# Patient Record
Sex: Female | Born: 1960
Health system: Southern US, Community
[De-identification: ages and names within clinical notes are randomized; demographics above are authoritative.]

## PROBLEM LIST (undated history)

## (undated) DIAGNOSIS — K649 Unspecified hemorrhoids: Secondary | ICD-10-CM

## (undated) DIAGNOSIS — K5792 Diverticulitis of intestine, part unspecified, without perforation or abscess without bleeding: Secondary | ICD-10-CM

## (undated) DIAGNOSIS — L988 Other specified disorders of the skin and subcutaneous tissue: Secondary | ICD-10-CM

## (undated) DIAGNOSIS — G4730 Sleep apnea, unspecified: Secondary | ICD-10-CM

## (undated) DIAGNOSIS — I10 Essential (primary) hypertension: Secondary | ICD-10-CM

## (undated) DIAGNOSIS — E7800 Pure hypercholesterolemia, unspecified: Secondary | ICD-10-CM

## (undated) DIAGNOSIS — E669 Obesity, unspecified: Secondary | ICD-10-CM

## (undated) DIAGNOSIS — Z9889 Other specified postprocedural states: Secondary | ICD-10-CM

## (undated) DIAGNOSIS — R112 Nausea with vomiting, unspecified: Secondary | ICD-10-CM

## (undated) DIAGNOSIS — L280 Lichen simplex chronicus: Secondary | ICD-10-CM

## (undated) DIAGNOSIS — J302 Other seasonal allergic rhinitis: Secondary | ICD-10-CM

## (undated) HISTORY — DX: Obesity, unspecified: E66.9

## (undated) HISTORY — DX: Pure hypercholesterolemia, unspecified: E78.00

## (undated) HISTORY — DX: Diverticulitis of intestine, part unspecified, without perforation or abscess without bleeding: K57.92

## (undated) HISTORY — DX: Lichen simplex chronicus: L28.0

## (undated) HISTORY — DX: Other seasonal allergic rhinitis: J30.2

## (undated) HISTORY — DX: Unspecified hemorrhoids: K64.9

---

## 1989-08-02 HISTORY — PX: NASAL SINUS SURGERY: SHX719

## 1999-06-17 ENCOUNTER — Other Ambulatory Visit: Admission: RE | Admit: 1999-06-17 | Discharge: 1999-06-17 | Payer: Self-pay | Admitting: Obstetrics and Gynecology

## 2000-06-29 ENCOUNTER — Other Ambulatory Visit: Admission: RE | Admit: 2000-06-29 | Discharge: 2000-06-29 | Payer: Self-pay | Admitting: Obstetrics and Gynecology

## 2001-07-10 ENCOUNTER — Other Ambulatory Visit: Admission: RE | Admit: 2001-07-10 | Discharge: 2001-07-10 | Payer: Self-pay | Admitting: Obstetrics and Gynecology

## 2002-10-08 ENCOUNTER — Other Ambulatory Visit: Admission: RE | Admit: 2002-10-08 | Discharge: 2002-10-08 | Payer: Self-pay | Admitting: Obstetrics and Gynecology

## 2003-08-03 HISTORY — PX: CHOLECYSTECTOMY: SHX55

## 2003-11-05 ENCOUNTER — Other Ambulatory Visit: Admission: RE | Admit: 2003-11-05 | Discharge: 2003-11-05 | Payer: Self-pay | Admitting: Obstetrics and Gynecology

## 2005-06-03 ENCOUNTER — Other Ambulatory Visit: Admission: RE | Admit: 2005-06-03 | Discharge: 2005-06-03 | Payer: Self-pay | Admitting: Obstetrics and Gynecology

## 2009-11-18 ENCOUNTER — Ambulatory Visit (HOSPITAL_COMMUNITY): Admission: RE | Admit: 2009-11-18 | Discharge: 2009-11-18 | Payer: Self-pay | Admitting: Obstetrics and Gynecology

## 2010-10-20 LAB — COMPREHENSIVE METABOLIC PANEL
ALT: 16 U/L (ref 0–35)
AST: 16 U/L (ref 0–37)
Albumin: 4.1 g/dL (ref 3.5–5.2)
Alkaline Phosphatase: 77 U/L (ref 39–117)
BUN: 12 mg/dL (ref 6–23)
CO2: 26 mEq/L (ref 19–32)
Calcium: 9.2 mg/dL (ref 8.4–10.5)
Chloride: 103 mEq/L (ref 96–112)
Creatinine, Ser: 0.57 mg/dL (ref 0.4–1.2)
GFR calc Af Amer: >60 mL/min (ref >60)
GFR calc non Af Amer: >60 mL/min (ref >60)
Glucose, Bld: 90 mg/dL (ref 70–99)
Potassium: 4.4 mEq/L (ref 3.5–5.1)
Sodium: 137 mEq/L (ref 135–145)
Total Bilirubin: 0.9 mg/dL (ref 0.3–1.2)
Total Protein: 7.7 g/dL (ref 6.0–8.3)

## 2010-10-20 LAB — CBC
HCT: 34.2 % — ABNORMAL LOW (ref 36.0–46.0)
Hemoglobin: 11.9 g/dL — ABNORMAL LOW (ref 12.0–15.0)
MCHC: 34.7 g/dL (ref 30.0–36.0)
MCV: 86.6 fL (ref 78.0–100.0)
Platelets: 339 10*3/uL (ref 150–400)
RBC: 3.95 MIL/uL (ref 3.87–5.11)
RDW: 13.9 % (ref 11.5–15.5)
WBC: 6.3 10*3/uL (ref 4.0–10.5)

## 2010-10-20 LAB — HCG, SERUM, QUALITATIVE: Preg, Serum: NEGATIVE

## 2013-08-02 HISTORY — PX: ABDOMINAL HYSTERECTOMY: SHX81

## 2014-05-30 ENCOUNTER — Other Ambulatory Visit: Payer: Self-pay | Admitting: Family Medicine

## 2014-05-30 DIAGNOSIS — R1030 Lower abdominal pain, unspecified: Secondary | ICD-10-CM

## 2014-05-30 DIAGNOSIS — Z90710 Acquired absence of both cervix and uterus: Secondary | ICD-10-CM

## 2014-06-07 ENCOUNTER — Other Ambulatory Visit: Payer: Self-pay

## 2014-08-02 HISTORY — PX: COLONOSCOPY: SHX174

## 2015-03-09 ENCOUNTER — Encounter (HOSPITAL_COMMUNITY): Payer: Self-pay

## 2015-03-09 ENCOUNTER — Emergency Department (HOSPITAL_COMMUNITY)
Admission: EM | Admit: 2015-03-09 | Discharge: 2015-03-09 | Disposition: A | Discharge status: Home / Self Care | Payer: 59 | Attending: Emergency Medicine | Admitting: Emergency Medicine

## 2015-03-09 ENCOUNTER — Emergency Department (HOSPITAL_COMMUNITY): Payer: 59

## 2015-03-09 DIAGNOSIS — R911 Solitary pulmonary nodule: Secondary | ICD-10-CM | POA: Diagnosis not present

## 2015-03-09 DIAGNOSIS — Z79899 Other long term (current) drug therapy: Secondary | ICD-10-CM | POA: Insufficient documentation

## 2015-03-09 DIAGNOSIS — Z9049 Acquired absence of other specified parts of digestive tract: Secondary | ICD-10-CM | POA: Insufficient documentation

## 2015-03-09 DIAGNOSIS — Z90710 Acquired absence of both cervix and uterus: Secondary | ICD-10-CM | POA: Diagnosis not present

## 2015-03-09 DIAGNOSIS — R1032 Left lower quadrant pain: Secondary | ICD-10-CM | POA: Diagnosis present

## 2015-03-09 DIAGNOSIS — K5732 Diverticulitis of large intestine without perforation or abscess without bleeding: Secondary | ICD-10-CM

## 2015-03-09 DIAGNOSIS — R109 Unspecified abdominal pain: Secondary | ICD-10-CM

## 2015-03-09 DIAGNOSIS — I10 Essential (primary) hypertension: Secondary | ICD-10-CM | POA: Diagnosis not present

## 2015-03-09 HISTORY — DX: Essential (primary) hypertension: I10

## 2015-03-09 LAB — CBC
HCT: 38.1 % (ref 36.0–46.0)
Hemoglobin: 12.6 g/dL (ref 12.0–15.0)
MCH: 27.9 pg (ref 26.0–34.0)
MCHC: 33.1 g/dL (ref 30.0–36.0)
MCV: 84.5 fL (ref 78.0–100.0)
Platelets: 137 10*3/uL — ABNORMAL LOW (ref 150–400)
RBC: 4.51 MIL/uL (ref 3.87–5.11)
RDW: 14.8 % (ref 11.5–15.5)
WBC: 7.2 10*3/uL (ref 4.0–10.5)

## 2015-03-09 LAB — URINALYSIS, ROUTINE W REFLEX MICROSCOPIC
Bilirubin Urine: NEGATIVE
Glucose, UA: NEGATIVE mg/dL
Hgb urine dipstick: NEGATIVE
Ketones, ur: NEGATIVE mg/dL
Leukocytes, UA: NEGATIVE
Nitrite: NEGATIVE
Protein, ur: NEGATIVE mg/dL
Specific Gravity, Urine: 1.005 (ref 1.005–1.030)
Urobilinogen, UA: 0.2 mg/dL (ref 0.0–1.0)
pH: 6 (ref 5.0–8.0)

## 2015-03-09 LAB — COMPREHENSIVE METABOLIC PANEL
ALT: 38 U/L (ref 14–54)
AST: 35 U/L (ref 15–41)
Albumin: 4.4 g/dL (ref 3.5–5.0)
Alkaline Phosphatase: 105 U/L (ref 38–126)
Anion gap: 6 (ref 5–15)
BUN: 14 mg/dL (ref 6–20)
CO2: 23 mmol/L (ref 22–32)
Calcium: 8.9 mg/dL (ref 8.9–10.3)
Chloride: 107 mmol/L (ref 101–111)
Creatinine, Ser: 0.62 mg/dL (ref 0.44–1.00)
GFR calc Af Amer: >60 mL/min (ref >60)
GFR calc non Af Amer: >60 mL/min (ref >60)
Glucose, Bld: 105 mg/dL — ABNORMAL HIGH (ref 65–99)
Potassium: 4.2 mmol/L (ref 3.5–5.1)
Sodium: 136 mmol/L (ref 135–145)
Total Bilirubin: 1.5 mg/dL — ABNORMAL HIGH (ref 0.3–1.2)
Total Protein: 8 g/dL (ref 6.5–8.1)

## 2015-03-09 LAB — LIPASE, BLOOD: Lipase: 16 U/L — ABNORMAL LOW (ref 22–51)

## 2015-03-09 MED ORDER — METRONIDAZOLE 500 MG PO TABS: 500.0000 mg | ORAL_TABLET | Freq: Three times a day (TID) | ORAL | Status: DC

## 2015-03-09 MED ORDER — HYDROCODONE-ACETAMINOPHEN 5-325 MG PO TABS: 1.0000 | ORAL_TABLET | ORAL | Status: DC | PRN

## 2015-03-09 MED ORDER — CIPROFLOXACIN HCL 500 MG PO TABS: 500.0000 mg | ORAL_TABLET | Freq: Two times a day (BID) | ORAL | Status: DC

## 2015-03-09 MED ORDER — ONDANSETRON 4 MG PO TBDP: 4.0000 mg | ORAL_TABLET | Freq: Three times a day (TID) | ORAL | Status: DC | PRN

## 2015-03-09 MED ORDER — ONDANSETRON HCL 4 MG/2ML IJ SOLN
4.0000 mg | Freq: Once | INTRAMUSCULAR | Status: AC
  Administered 2015-03-09: 4 mg via INTRAVENOUS
  Filled 2015-03-09: qty 2

## 2015-03-09 MED ORDER — METRONIDAZOLE 500 MG PO TABS
500.0000 mg | ORAL_TABLET | Freq: Once | ORAL | Status: AC
  Administered 2015-03-09: 500 mg via ORAL
  Filled 2015-03-09: qty 1

## 2015-03-09 MED ORDER — CIPROFLOXACIN HCL 500 MG PO TABS
500.0000 mg | ORAL_TABLET | Freq: Once | ORAL | Status: AC
  Administered 2015-03-09: 500 mg via ORAL
  Filled 2015-03-09: qty 1

## 2015-03-09 MED ORDER — MORPHINE SULFATE 4 MG/ML IJ SOLN
4.0000 mg | INTRAMUSCULAR | Status: DC | PRN
Start: 2015-03-09 — End: 2015-03-10
  Administered 2015-03-09: 4 mg via INTRAVENOUS
  Filled 2015-03-09: qty 1

## 2015-03-09 MED ORDER — IOHEXOL 300 MG/ML  SOLN
100.0000 mL | Freq: Once | INTRAMUSCULAR | Status: AC | PRN
  Administered 2015-03-09: 100 mL via INTRAVENOUS

## 2015-03-09 MED ORDER — IOHEXOL 300 MG/ML  SOLN
25.0000 mL | Freq: Once | INTRAMUSCULAR | Status: AC | PRN
  Administered 2015-03-09: 25 mL via ORAL

## 2015-03-09 NOTE — ED Provider Notes (Signed)
CSN: [id number]     Arrival date & time 03/09/15  1602 History   First MD Initiated Contact with Patient 03/09/15 1719     Chief Complaint  Patient presents with  . Abdominal Pain  . Fever      HPI  Vision presents for evaluation of left lower quadrant pain. Symptoms for 3 days. Nausea today but no vomiting. Fever to 102 at home today. Seen in urgent care and referred here. Tomato a few years ago. Had some intermittent crampy abdominal pain for the last year. At one point was tried on a medication "for irritable bowel". None recently.  Denies bloody stools. Has had prior colonoscopy which demonstrated diverticuli. No episodes of diverticulitis in the past.   Past Medical History  Diagnosis Date  . Hypertension    Past Surgical History  Procedure Laterality Date  . Abdominal hysterectomy    . Cholecystectomy     History reviewed. No pertinent family history. History  Substance Use Topics  . Smoking status: Never Smoker   . Smokeless tobacco: Not on file  . Alcohol Use: No   OB History    No data available     Review of Systems  Constitutional: Positive for fever. Negative for chills, diaphoresis, appetite change and fatigue.  HENT: Negative for mouth sores, sore throat and trouble swallowing.   Eyes: Negative for visual disturbance.  Respiratory: Negative for cough, chest tightness, shortness of breath and wheezing.   Cardiovascular: Negative for chest pain.  Gastrointestinal: Positive for nausea and abdominal pain. Negative for vomiting, diarrhea and abdominal distention.  Endocrine: Negative for polydipsia, polyphagia and polyuria.  Genitourinary: Negative for dysuria, frequency and hematuria.  Musculoskeletal: Negative for gait problem.  Skin: Negative for color change, pallor and rash.  Neurological: Negative for dizziness, syncope, light-headedness and headaches.  Hematological: Does not bruise/bleed easily.  Psychiatric/Behavioral: Negative for behavioral  problems and confusion.      Allergies  Peanut-containing drug products and Sulfa antibiotics  Home Medications   Prior to Admission medications   Medication Sig Start Date End Date Taking? Authorizing Provider  acetaminophen (TYLENOL) 500 MG tablet Take 1,000 mg by mouth every 6 (six) hours as needed for mild pain or headache.   Yes Historical Provider, MD  Alum & Mag Hydroxide-Simeth (ANTACID ADVANCED PO) Take 1 tablet by mouth daily.   Yes Historical Provider, MD  Docusate Calcium (STOOL SOFTENER PO) Take 1 capsule by mouth daily.   Yes Historical Provider, MD  fexofenadine (ALLEGRA) 180 MG tablet Take 180 mg by mouth daily.   Yes Historical Provider, MD  metoprolol succinate (TOPROL-XL) 50 MG 24 hr tablet Take 50 mg by mouth daily with breakfast. 02/14/15  Yes Historical Provider, MD  Probiotic Product (PROBIOTIC DAILY) CAPS Take 1 capsule by mouth daily.   Yes Historical Provider, MD  ciprofloxacin (CIPRO) 500 MG tablet Take 1 tablet (500 mg total) by mouth every 12 (twelve) hours. 03/09/15   Rolland Porter, MD  HYDROcodone-acetaminophen (NORCO/VICODIN) 5-325 MG per tablet Take 1 tablet by mouth every 4 (four) hours as needed. 03/09/15   Rolland Porter, MD  metroNIDAZOLE (FLAGYL) 500 MG tablet Take 1 tablet (500 mg total) by mouth 3 (three) times daily. 03/09/15   Rolland Porter, MD  ondansetron (ZOFRAN ODT) 4 MG disintegrating tablet Take 1 tablet (4 mg total) by mouth every 8 (eight) hours as needed for nausea. 03/09/15   Rolland Porter, MD   BP 132/72 mmHg  Pulse 93  Temp(Src) 99.7 F (37.6 C) (  Oral)  Resp 16  SpO2 96% Physical Exam  Constitutional: She is oriented to person, place, and time. She appears well-developed and well-nourished. No distress.  HENT:  Head: Normocephalic.  Eyes: Conjunctivae are normal. Pupils are equal, round, and reactive to light. No scleral icterus.  Neck: Normal range of motion. Neck supple. No thyromegaly present.  Cardiovascular: Normal rate and regular rhythm.  Exam  reveals no gallop and no friction rub.   No murmur heard. Pulmonary/Chest: Effort normal and breath sounds normal. No respiratory distress. She has no wheezes. She has no rales.  Abdominal: Soft. Bowel sounds are normal. She exhibits no distension. There is tenderness. There is no rebound.    Tenderness without rebound.  Musculoskeletal: Normal range of motion.  Neurological: She is alert and oriented to person, place, and time.  Skin: Skin is warm and dry. No rash noted.  Psychiatric: She has a normal mood and affect. Her behavior is normal.    ED Course  Procedures (including critical care time) Labs Review Labs Reviewed  LIPASE, BLOOD - Abnormal; Notable for the following:    Lipase 16 (*)    All other components within normal limits  COMPREHENSIVE METABOLIC PANEL - Abnormal; Notable for the following:    Glucose, Bld 105 (*)    Total Bilirubin 1.5 (*)    All other components within normal limits  CBC - Abnormal; Notable for the following:    Platelets 137 (*)    All other components within normal limits  URINALYSIS, ROUTINE W REFLEX MICROSCOPIC (NOT AT [facility])    Imaging Review Ct Abdomen Pelvis W Contrast  03/09/2015   CLINICAL DATA:  Left lower quadrant abdominal pain and fever for 1 day.  EXAM: CT ABDOMEN AND PELVIS WITH CONTRAST  TECHNIQUE: Multidetector CT imaging of the abdomen and pelvis was performed using the standard protocol following bolus administration of intravenous contrast.  CONTRAST:  OMNIPAQUE IOHEXOL 300 MG/ML SOLN, 59mL OMNIPAQUE IOHEXOL 300 MG/ML SOLN  COMPARISON:  None.  FINDINGS: Lower chest:  Small pulmonary nodules are noted at the lung bases.  3.5 mm nodule in the right middle lobe on image 1.  5.5 mm subpleural pulmonary nodule in the right lower lobe on image 6.  13 mm right lower lobe pulmonary nodule on image number 8.  3 mm subpleural nodule in the left lower lobe on image number 5 appear  4.5 mm nodule in the left lower lobe on image number 11.   These are in indeterminate finding but I would recommend a full dedicated chest CT with contrast for further evaluation. No pleural effusion. The heart is normal in size. No pericardial effusion. There is a small hiatal hernia.  Hepatobiliary: Mild diffuse fatty infiltration of the liver but no focal hepatic lesions or intrahepatic biliary dilatation. The gallbladder surgically absent. No common bile duct dilatation.  Pancreas: No mass, inflammation or ductal dilatation.  Spleen: Normal size.  No focal lesions.  Adrenals/Urinary Tract: The adrenal glands are not normal. There is an upper pole right renal calculus but no obstructing ureteral calculi or bladder calculi. No renal mass lesions. No bladder lesions.  Stomach/Bowel: The stomach, duodenum and small bowel are unremarkable. No inflammatory changes, mass lesions or obstructive findings.  There is acute uncomplicated diverticulitis involving the upper sigmoid colon with marked wall thickening, submucosal edema and surrounding inflammation in the sigmoid mesocolon. No abscess or free air.  Vascular/Lymphatic: No mesenteric or retroperitoneal mass or adenopathy. Small scattered lymph nodes are noted. The  aorta and branch vessels are normal. The major venous structures are patent.  Other: There are mildly enlarged/inflamed pelvic lymph nodes. No free pelvic fluid collections. The bladder is normal. The uterus is surgically absent. No inguinal mass or adenopathy. There is a periumbilical abdominal wall hernia containing fat.  Musculoskeletal: No significant bony findings.  IMPRESSION: 1. Uncomplicated acute diverticulitis involving the upper sigmoid colon. 2. Small hiatal hernia. 3. Right renal calculus. 4. Multiple pulmonary nodules are noted at the lung bases. Recommend dedicated full chest CT for further evaluation (non urgent).   Electronically Signed   By: Rudie Meyer M.D.   On: 03/09/2015 20:25     EKG Interpretation None      MDM   Final  diagnoses:  AP (abdominal pain)  Diverticulitis of large intestine without perforation or abscess without bleeding  Pulmonary nodule    I discussed the brain nodules with her. Recommended she undergo routine follow-up with her primary care physician for outpatient CT of the chest.  He is tolerating by mouth. Her pain and nausea are under control. Given first dose Cipro and Flagyl by mouth. She has a strong desire to be treated at home. I think this is appropriate. Plan is home, liquid diet. Low residue diet until improved or resolved. Cipro, Flagyl, Vicodin, Zofran. Primary care follow-up as above.    Rolland Porter, MD 03/09/15 2109

## 2015-03-09 NOTE — ED Notes (Signed)
Pt c/o LLQ pain and fever x 1 day.  Pain score 10/10 w/ palpation.  Denies n/v/d.  Pt reports she was sent by an Urgent Care.

## 2015-03-09 NOTE — Discharge Instructions (Signed)
Clear liquid diet. Slowly advance to a low residue diet as tolerated. Return to emergency room with worsening pain and vomiting or inability to keep down fluids or medications. You have benign appearing pulmonary nodules on your CT scan. It is recommended that you get a follow-up CT scan of your chest. This can be done routinely through your primary care physician.  Abdominal Pain Many things can cause abdominal pain. Usually, abdominal pain is not caused by a disease and will improve without treatment. It can often be observed and treated at home. Your health care provider will do a physical exam and possibly order blood tests and X-rays to help determine the seriousness of your pain. However, in many cases, more time must pass before a clear cause of the pain can be found. Before that point, your health care provider may not know if you need more testing or further treatment. HOME CARE INSTRUCTIONS  Monitor your abdominal pain for any changes. The following actions may help to alleviate any discomfort you are experiencing:  Only take over-the-counter or prescription medicines as directed by your health care provider.  Do not take laxatives unless directed to do so by your health care provider.  Try a clear liquid diet (broth, tea, or water) as directed by your health care provider. Slowly move to a bland diet as tolerated. SEEK MEDICAL CARE IF:  You have unexplained abdominal pain.  You have abdominal pain associated with nausea or diarrhea.  You have pain when you urinate or have a bowel movement.  You experience abdominal pain that wakes you in the night.  You have abdominal pain that is worsened or improved by eating food.  You have abdominal pain that is worsened with eating fatty foods.  You have a fever. SEEK IMMEDIATE MEDICAL CARE IF:   Your pain does not go away within 2 hours.  You keep throwing up (vomiting).  Your pain is felt only in portions of the abdomen, such as  the right side or the left lower portion of the abdomen.  You pass bloody or black tarry stools. MAKE SURE YOU:  Understand these instructions.   Will watch your condition.   Will get help right away if you are not doing well or get worse.  Document Released: 04/28/2005 Document Revised: 07/24/2013 Document Reviewed: 03/28/2013 [facility] Patient Information 2015 [place], [place]. This information is not intended to replace advice given to you by your health care provider. Make sure you discuss any questions you have with your health care provider.  Diverticulitis Diverticulitis is inflammation or infection of small pouches in your colon that form when you have a condition called diverticulosis. The pouches in your colon are called diverticula. Your colon, or large intestine, is where water is absorbed and stool is formed. Complications of diverticulitis can include:  Bleeding.  Severe infection.  Severe pain.  Perforation of your colon.  Obstruction of your colon. CAUSES  Diverticulitis is caused by bacteria. Diverticulitis happens when stool becomes trapped in diverticula. This allows bacteria to grow in the diverticula, which can lead to inflammation and infection. RISK FACTORS People with diverticulosis are at risk for diverticulitis. Eating a diet that does not include enough fiber from fruits and vegetables may make diverticulitis more likely to develop. SYMPTOMS  Symptoms of diverticulitis may include:  Abdominal pain and tenderness. The pain is normally located on the left side of the abdomen, but may occur in other areas.  Fever and chills.  Bloating.  Cramping.  Nausea.  Vomiting.  Constipation.  Diarrhea.  Blood in your stool. DIAGNOSIS  Your health care provider will ask you about your medical history and do a physical exam. You may need to have tests done because many medical conditions can cause the same symptoms as diverticulitis. Tests may  include:  Blood tests.  Urine tests.  Imaging tests of the abdomen, including X-rays and CT scans. When your condition is under control, your health care provider may recommend that you have a colonoscopy. A colonoscopy can show how severe your diverticula are and whether something else is causing your symptoms. TREATMENT  Most cases of diverticulitis are mild and can be treated at home. Treatment may include:  Taking over-the-counter pain medicines.  Following a clear liquid diet.  Taking antibiotic medicines by mouth for 7-10 days. More severe cases may be treated at a hospital. Treatment may include:  Not eating or drinking.  Taking prescription pain medicine.  Receiving antibiotic medicines through an IV tube.  Receiving fluids and nutrition through an IV tube.  Surgery. HOME CARE INSTRUCTIONS   Follow your health care provider's instructions carefully.  Follow a full liquid diet or other diet as directed by your health care provider. After your symptoms improve, your health care provider may tell you to change your diet. He or she may recommend you eat a high-fiber diet. Fruits and vegetables are good sources of fiber. Fiber makes it easier to pass stool.  Take fiber supplements or probiotics as directed by your health care provider.  Only take medicines as directed by your health care provider.  Keep all your follow-up appointments. SEEK MEDICAL CARE IF:   Your pain does not improve.  You have a hard time eating food.  Your bowel movements do not return to normal. SEEK IMMEDIATE MEDICAL CARE IF:   Your pain becomes worse.  Your symptoms do not get better.  Your symptoms suddenly get worse.  You have a fever.  You have repeated vomiting.  You have bloody or black, tarry stools. MAKE SURE YOU:   Understand these instructions.  Will watch your condition.  Will get help right away if you are not doing well or get worse. Document Released: 04/28/2005  Document Revised: 07/24/2013 Document Reviewed: 06/13/2013 [facility] Patient Information 2015 [place], [place]. This information is not intended to replace advice given to you by your health care provider. Make sure you discuss any questions you have with your health care provider.  Pulmonary Nodule  A pulmonary nodule is a small, round spot in your lung. It is usually found when pictures of your lungs are taken for other reasons. Most pulmonary nodules are not cancerous and do not cause symptoms. Tests will be done to make sure the nodule is not cancerous. Pulmonary nodules that are not cancerous usually do not require treatment. HOME CARE   Only take medicine as told by your doctor.  Follow up with your doctor as told. GET HELP IF:  You have trouble breathing when doing activities.  You feel sick or more tired than normal.  You do not feel like eating.  You lose weight without trying to.  You have chills.  You have night sweats. GET HELP RIGHT AWAY IF:  You cannot catch your breath.  You start making whistling sounds when breathing (wheezing).  You have a cough that does not go away.  You cough up blood.  You are dizzy or feel like you are going to pass out.  You have sudden chest pain.  You have a fever or lasting symptoms for more than 2-3 days.  You have a fever and your symptoms suddenly get worse. MAKE SURE YOU:  Understand these instructions.  Will watch your condition.  Will get help right away if you are not doing well or get worse. Document Released: 08/21/2010 Document Revised: 03/21/2013 Document Reviewed: 01/08/2013 [facility] Patient Information 2015 [place], [place]. This information is not intended to replace advice given to you by your health care provider. Make sure you discuss any questions you have with your health care provider.

## 2015-04-10 ENCOUNTER — Other Ambulatory Visit: Payer: Self-pay | Admitting: Obstetrics and Gynecology

## 2015-04-11 LAB — CYTOLOGY - PAP

## 2015-08-12 ENCOUNTER — Other Ambulatory Visit: Payer: Self-pay | Admitting: Family Medicine

## 2015-08-12 DIAGNOSIS — R918 Other nonspecific abnormal finding of lung field: Secondary | ICD-10-CM

## 2015-09-12 ENCOUNTER — Ambulatory Visit
Admission: RE | Admit: 2015-09-12 | Discharge: 2015-09-12 | Disposition: A | Discharge status: Home / Self Care | Payer: 59 | Source: Ambulatory Visit | Attending: Family Medicine | Admitting: Family Medicine

## 2015-09-12 DIAGNOSIS — R918 Other nonspecific abnormal finding of lung field: Secondary | ICD-10-CM

## 2015-10-14 ENCOUNTER — Encounter: Payer: Self-pay | Admitting: Pulmonary Disease

## 2015-10-14 ENCOUNTER — Encounter: Payer: Self-pay | Admitting: *Deleted

## 2015-10-15 ENCOUNTER — Other Ambulatory Visit (INDEPENDENT_AMBULATORY_CARE_PROVIDER_SITE_OTHER): Payer: 59

## 2015-10-15 ENCOUNTER — Ambulatory Visit (INDEPENDENT_AMBULATORY_CARE_PROVIDER_SITE_OTHER): Payer: 59 | Admitting: Pulmonary Disease

## 2015-10-15 ENCOUNTER — Encounter: Payer: Self-pay | Admitting: Pulmonary Disease

## 2015-10-15 VITALS — BP 134/74 | HR 79 | Ht 64.5 in | Wt 195.4 lb

## 2015-10-15 DIAGNOSIS — R918 Other nonspecific abnormal finding of lung field: Secondary | ICD-10-CM | POA: Diagnosis not present

## 2015-10-15 LAB — SEDIMENTATION RATE: Sed Rate: 35 mm/hr — ABNORMAL HIGH (ref 0–22)

## 2015-10-15 LAB — RHEUMATOID FACTOR: Rhuematoid fact SerPl-aCnc: <10 IU/mL (ref <=14)

## 2015-10-15 MED ORDER — ALPRAZOLAM 0.5 MG PO TABS: ORAL_TABLET | ORAL | Status: DC

## 2015-10-15 NOTE — Progress Notes (Addendum)
Subjective:    Patient ID: Natasha Wilkinson, female    DOB: 01-28-1961, 55 y.o.   MRN: [id number]  HPI Consult for abnormal CT scan.  Mrs. Natasha Wilkinson is a 55 year old with past medical history as below. She underwent a CT of the abdomen to evaluate for diverticulitis in 2016. This showed basal pulmonary nodules. She had a follow-up CT scan last month which confirmed multiple pulmonary nodules. The basal nodule has been stable for the past 6 months. She's been sent to pulmonary clinic for further evaluation.  She denies any pulmonary symptoms. No cough, sputum production, or dyspnea, wheezing, hemoptysis. She is up-to-date with her cancer screening. She's had a colonoscopy last year which was incomplete due to difficulty passing the scope. She's currently being evaluated with stool cards and a repeat scope planned in 5 year. She's had a hysterectomy in 2015 for fibroids and uterine bleeding.  She lived in [place] all her life with no travel except for a cruise to the [place] a few months ago. She's had no significant exposure to TB or other factors.  DATA: Pap smear 04/10/15 - Negative for malignancy Endometrial dilatation and curretage, hysteroscopy 11/18/09 - Negative for malignancy. Colonoscopy   CT abd 03/09/15 Acute diverticulitis, hiatal hernia, multiple pulmonary nodules.  CT chest 09/12/15 Multiple pulmonary nodules. Largest one in the right lower lobe. Stable since CT of the abdomen from 2016  Social History: Never smoker, alcohol use. No drug use. She works as an Environmental health practitioner. Denies exposures at work or at home.  Family History: Mother-aortic aneurysm, heart disease, hypertension. Father-colon polyps, prostate cancer.  Past Medical History  Diagnosis Date  . Hypertension   . Hypercholesteremia   . Seasonal allergies   . Obesity   . Lichenoid dermatitis   . Diverticulitis   . Hemorrhoid     Current outpatient prescriptions:  .  Docusate Calcium (STOOL SOFTENER  PO), Take 1 capsule by mouth daily., Disp: , Rfl:  .  famotidine (PEPCID) 20 MG tablet, Take 20 mg by mouth daily., Disp: , Rfl:  .  fexofenadine (ALLEGRA) 180 MG tablet, Take 180 mg by mouth daily., Disp: , Rfl:  .  metoprolol succinate (TOPROL-XL) 50 MG 24 hr tablet, Take 50 mg by mouth daily with breakfast., Disp: , Rfl: 0 .  Multiple Vitamin (MULTIVITAMIN) capsule, Take 1 capsule by mouth daily., Disp: , Rfl:  .  Probiotic Product (ALIGN) 4 MG CAPS, Take 4 mg by mouth daily., Disp: , Rfl:  .  VENTOLIN HFA 108 (90 Base) MCG/ACT inhaler, Inhale 2 puffs into the lungs every 4 (four) hours as needed., Disp: , Rfl: 0 .  Wheat Dextrin (BENEFIBER DRINK MIX PO), 1 tablespoon once daily, Disp: , Rfl:  .  ranitidine (ZANTAC) 150 MG tablet, Take 150 mg by mouth daily. Reported on 10/15/2015, Disp: , Rfl:    Review of Systems Denies any cough, sputum production, fevers, chills, wheezing. No chest pain, palpitation. No nausea, vomiting, diarrhea, constipation. No rash, joint pain, joint tenderness, morning stiffness. All other review of systems are negative    Objective:   Physical Exam Blood pressure 134/74, pulse 79, height 5' 4.5" (1.638 m), weight 195 lb 6.4 oz (88.633 kg), SpO2 95 %. Gen: No apparent distress Neuro: No gross focal deficits. Neck: No JVD, lymphadenopathy, thyromegaly. RS: Clear, No wheeze or crackles CVS: S1-S2 heard, no murmurs rubs gallops. Abdomen: Soft, positive bowel sounds. Extremities: No edema.    Assessment & Plan:  Eval for Multiple pulmonary nodules.  Differential diagnosis includes malignancy, infection including mycobacterial, fungal, noninfectious process such as rheumatoid arthritis, connective tissue disease, Wegners etc. The largest basal lung nodule has been stable for 6 months which is reassuring. She is healthy otherwise with no evidence of immunocompromise, travel history that may predispose to fungal, opportunistic infections. She does not have any  symptoms of connective tissue, autoimmune disease.  I will evaluate by arranging for a PET scan. I'll also get blood work to evaluate serologies for connective tissue, autoimmune disease and fungal disease such as cocci, blasto, histo.  Plan: - PET scan - Blood work for autoimmune, connective tissue disease.   Chilton Greathouse MD [facility] Pager [phone] If no answer or after 3pm call: [phone] 10/15/2015, 5:26 PM

## 2015-10-15 NOTE — Patient Instructions (Addendum)
We will schedule you for a PET scan You will sent for blood work to evaluate for connective tissue disease, fungal disease  Return to clinic in 1 month.

## 2015-10-16 LAB — ANA: Anti Nuclear Antibody(ANA): NEGATIVE

## 2015-10-16 LAB — C-ANCA TITER: C-ANCA: 1:160 {titer} — ABNORMAL HIGH

## 2015-10-16 LAB — CYCLIC CITRUL PEPTIDE ANTIBODY, IGG: Cyclic Citrullin Peptide Ab: >250 Units — ABNORMAL HIGH

## 2015-10-16 LAB — ANCA SCREEN W REFLEX TITER: ANCA Screen: POSITIVE — AB

## 2015-10-20 LAB — FUNGAL ANTIBODIES PANEL, ID-BLOOD
Aspergillus Flavus Antibodies: NEGATIVE
Aspergillus Niger Antibodies: NEGATIVE
Aspergillus fumigatus: NEGATIVE
Blastomyces Abs, Qn, DID: NEGATIVE
Coccidioides Antibody ID: NEGATIVE
Histoplasma Antibody, ID: NEGATIVE

## 2015-10-23 ENCOUNTER — Ambulatory Visit (HOSPITAL_COMMUNITY)
Admission: RE | Admit: 2015-10-23 | Discharge: 2015-10-23 | Disposition: A | Discharge status: Home / Self Care | Payer: 59 | Source: Ambulatory Visit | Attending: Pulmonary Disease | Admitting: Pulmonary Disease

## 2015-10-23 DIAGNOSIS — K760 Fatty (change of) liver, not elsewhere classified: Secondary | ICD-10-CM | POA: Insufficient documentation

## 2015-10-23 DIAGNOSIS — R918 Other nonspecific abnormal finding of lung field: Secondary | ICD-10-CM | POA: Diagnosis present

## 2015-10-23 DIAGNOSIS — K5730 Diverticulosis of large intestine without perforation or abscess without bleeding: Secondary | ICD-10-CM | POA: Diagnosis not present

## 2015-10-23 DIAGNOSIS — K439 Ventral hernia without obstruction or gangrene: Secondary | ICD-10-CM | POA: Insufficient documentation

## 2015-10-23 DIAGNOSIS — N200 Calculus of kidney: Secondary | ICD-10-CM | POA: Diagnosis not present

## 2015-10-23 LAB — GLUCOSE, CAPILLARY: Glucose-Capillary: 77 mg/dL (ref 65–99)

## 2015-10-23 MED ORDER — FLUDEOXYGLUCOSE F - 18 (FDG) INJECTION
9.6700 | Freq: Once | INTRAVENOUS | Status: AC | PRN
  Administered 2015-10-23: 9.67 via INTRAVENOUS

## 2015-10-24 ENCOUNTER — Telehealth: Payer: Self-pay | Admitting: Pulmonary Disease

## 2015-10-24 NOTE — Telephone Encounter (Signed)
Patient aware of results and mailed copy to patient per pt's request. Nothing further needed.  FYI to PM

## 2015-10-24 NOTE — Telephone Encounter (Signed)
[phone] ext [phone] calling back

## 2015-10-24 NOTE — Telephone Encounter (Signed)
Please call the patient and relay the following:  I have reviewed the patient's PET/CT scan. The nodules in her lungs have not changed in size. None of these nodules are hypermetabolic making cancer significantly less likely but not completely impossible. Her blood work does suggest the possibility of an autoimmune process as the cause for her nodules and will be addressed further by Dr. Isaiah Serge on his return. Incidentally, she does have some diverticulosis with suggestion of some inflammatory changes and if she is having any abdominal pain should contact her primary care physician. She also has a tiny stone in her right kidney but this is not obstructing or causing any problem on imaging. She also has a small tiny hernia near her bellybutton as well. Instructed her to call our office again for any further questions.

## 2015-10-24 NOTE — Telephone Encounter (Signed)
lmtcb X1 for pt  

## 2015-10-24 NOTE — Telephone Encounter (Signed)
Spoke with pt, requesting PET results from yesterday.  Pt requesting results asap.  Sending to DOD as PM is unavailable today.  Pt wishes to be contacted on her cell phone listed on file if she is unavailable at her work number listed below.    JN please advise.  Thanks!

## 2015-11-05 ENCOUNTER — Encounter: Payer: Self-pay | Admitting: Pulmonary Disease

## 2015-11-05 ENCOUNTER — Ambulatory Visit (INDEPENDENT_AMBULATORY_CARE_PROVIDER_SITE_OTHER): Payer: 59 | Admitting: Pulmonary Disease

## 2015-11-05 VITALS — BP 128/82 | HR 83 | Ht 64.5 in | Wt 190.0 lb

## 2015-11-05 DIAGNOSIS — R918 Other nonspecific abnormal finding of lung field: Secondary | ICD-10-CM | POA: Diagnosis not present

## 2015-11-05 DIAGNOSIS — M069 Rheumatoid arthritis, unspecified: Secondary | ICD-10-CM

## 2015-11-05 NOTE — Patient Instructions (Signed)
We will start you on arnuity elipta 100 mcg once daily. If this is not covered by the insurance then we can consider flovent or qvar.   We will make a referral to rheumatology. Return to clinic in 6 months.

## 2015-11-05 NOTE — Progress Notes (Signed)
Subjective:    Patient ID: Natasha Wilkinson, female    DOB: 1960/11/17, 55 y.o.   MRN: [id number]  HPI Follow up for lung nodules.  Natasha Wilkinson is a 55 year old with past medical history as below. She underwent a CT of the abdomen to evaluate for diverticulitis in 2016. This showed basal pulmonary nodules. She had a follow-up CT scan last month which confirmed multiple pulmonary nodules. The basal nodule has been stable for the past 6 months. She's been sent to pulmonary clinic for further evaluation.  She is up-to-date with her cancer screening. She's had a colonoscopy last year which was incomplete due to difficulty passing the scope. She's currently being evaluated with stool cards and a repeat scope planned in 5 year. She's had a hysterectomy in 2015 for fibroids and uterine bleeding. She lived in [place] all her life with no travel except for a cruise to the [place] a few months ago. She's had no significant exposure to TB or other factors.  She does not increase in dyspnea, wheezing or the past few days. She's been using her albuterol inhaler with relief in symptoms. Results of the PET scan reviewed with her. She has followed up with the primary care physician for the findings of diverticulitis and is now on Cipro and Flagyl.  DATA: Pap smear 04/10/15 - Negative for malignancy Endometrial dilatation and curretage, hysteroscopy 11/18/09 - Negative for malignancy. Colonoscopy   CT abd 03/09/15 Acute diverticulitis, hiatal hernia, multiple pulmonary nodules.  CT chest 09/12/15 Multiple pulmonary nodules. Largest one in the right lower lobe. Stable since CT of the abdomen from 2016.  10/15/15 Balso, Histo, cocci ab - Negative ESR 35 ANA- Neg Rheumatoid factor < 10 CCP > 250 c- ANCA 1:160  PET scan chest 10/23/15 Negative activity in pulmonary nodules, diverticulosis, renal stone, umbilical hernia, hepatic steatosis.   Social History: Never smoker, alcohol use. No drug use. She works as  an Web designer. Denies exposures at work or at home.  Family History: Mother-aortic aneurysm, heart disease, hypertension. Father-colon polyps, prostate cancer.  Past Medical History  Diagnosis Date  . Hypertension   . Hypercholesteremia   . Seasonal allergies   . Obesity   . Lichenoid dermatitis   . Diverticulitis   . Hemorrhoid     Current outpatient prescriptions:  .  ALPRAZolam (XANAX) 0.5 MG tablet, Take 1 tablet by mouth 1 hour prior to procedure, Disp: 2 tablet, Rfl: 0 .  Docusate Calcium (STOOL SOFTENER PO), Take 1 capsule by mouth daily., Disp: , Rfl:  .  famotidine (PEPCID) 20 MG tablet, Take 20 mg by mouth daily., Disp: , Rfl:  .  fexofenadine (ALLEGRA) 180 MG tablet, Take 180 mg by mouth daily., Disp: , Rfl:  .  metoprolol succinate (TOPROL-XL) 50 MG 24 hr tablet, Take 50 mg by mouth daily with breakfast., Disp: , Rfl: 0 .  Multiple Vitamin (MULTIVITAMIN) capsule, Take 1 capsule by mouth daily., Disp: , Rfl:  .  Probiotic Product (ALIGN) 4 MG CAPS, Take 4 mg by mouth daily., Disp: , Rfl:  .  ranitidine (ZANTAC) 150 MG tablet, Take 150 mg by mouth daily. Reported on 10/15/2015, Disp: , Rfl:  .  VENTOLIN HFA 108 (90 Base) MCG/ACT inhaler, Inhale 2 puffs into the lungs every 4 (four) hours as needed., Disp: , Rfl: 0 .  Wheat Dextrin (BENEFIBER DRINK MIX PO), 1 tablespoon once daily, Disp: , Rfl:    Review of Systems Denies any cough, sputum production, fevers, chills,  wheezing. No chest pain, palpitation. No nausea, vomiting, diarrhea, constipation. No rash, joint pain, joint tenderness, morning stiffness. All other review of systems are negative    Objective:   Physical Exam Blood pressure 128/82, pulse 83, height 5' 4.5" (1.638 m), weight 190 lb (86.183 kg), SpO2 99 %. Gen: No apparent distress Neuro: No gross focal deficits. Neck: No JVD, lymphadenopathy, thyromegaly. RS: Clear, No wheeze or crackles CVS: S1-S2 heard, no murmurs rubs  gallops. Abdomen: Soft, positive bowel sounds. Extremities: No edema.    Assessment & Plan:  Eval for Multiple pulmonary nodules.  She's had a negative PET scan which is reassuring although it does not completely rule out malignancy Differential diagnosis includes noninfectious process such as rheumatoid arthritis, connective tissue disease, Wegners etc. Serologies for fungal disease such as cocci, blasto, histo is negative. She is healthy otherwise with no evidence of immunocompromise, travel history that may predispose to fungal, opportunistic infections. Her serologies positive for CCP and ANCA. She does admit to some joint pain today in the office. I will send her to rheumatology for further evaluation.   She is a little more short of breath with wheezing today and is using albuterol with little relief. I'll start her on a steroid inhaler. I'll get PFTs at next visit after this acute episode has resolved.  Plan: - Follow up with CT scan in 1 year. - Start arnuity inhaler. - PFTs at next visit - Rheumatology consult.  Marshell Garfinkel MD  Pulmonary and Critical Care Pager [phone] If no answer or after 3pm call: [phone] 11/05/2015, 2:43 PM

## 2015-11-07 ENCOUNTER — Telehealth: Payer: Self-pay | Admitting: Pulmonary Disease

## 2015-11-07 NOTE — Telephone Encounter (Signed)
ATC pt - VM full, not able to leave msg.  WCB

## 2015-11-10 NOTE — Telephone Encounter (Signed)
ATC pt x 1, mailbox full, unable to leave [email or web address]

## 2015-11-11 MED ORDER — VENTOLIN HFA 108 (90 BASE) MCG/ACT IN AERS: 2.0000 | INHALATION_SPRAY | RESPIRATORY_TRACT | Status: DC | PRN

## 2015-11-11 NOTE — Telephone Encounter (Signed)
Spoke with pt. She needs a refill on Ventolin. Rx has been sent in. Nothing further was needed.

## 2015-11-19 ENCOUNTER — Ambulatory Visit: Payer: 59 | Admitting: Pulmonary Disease

## 2015-11-21 ENCOUNTER — Telehealth: Payer: Self-pay | Admitting: Pulmonary Disease

## 2015-11-21 MED ORDER — FLUTICASONE FUROATE 100 MCG/ACT IN AEPB: 1.0000 | INHALATION_SPRAY | Freq: Every day | RESPIRATORY_TRACT | Status: DC

## 2015-11-21 NOTE — Telephone Encounter (Signed)
Spoke with pt, states she's been using samples of arnuity which is working well for her, wants to know if she should keep taking this.  I advised that this is a long term maintenance inhaler, and that we would like for her to continue using it.  rx sent to pharmacy.  Advised pt that if rx is not covered by insurance to call us back.  Pt expressed understanding.  Nothing further needed.

## 2015-11-21 NOTE — Telephone Encounter (Signed)
Returned call, Call patient at [phone] first at ext [phone], if no answer call [phone].

## 2015-11-21 NOTE — Telephone Encounter (Signed)
Left message for patient to call back  

## 2015-12-01 DIAGNOSIS — L280 Lichen simplex chronicus: Secondary | ICD-10-CM | POA: Insufficient documentation

## 2015-12-01 DIAGNOSIS — E669 Obesity, unspecified: Secondary | ICD-10-CM | POA: Insufficient documentation

## 2015-12-01 DIAGNOSIS — E7800 Pure hypercholesterolemia, unspecified: Secondary | ICD-10-CM | POA: Insufficient documentation

## 2015-12-01 DIAGNOSIS — K649 Unspecified hemorrhoids: Secondary | ICD-10-CM | POA: Insufficient documentation

## 2015-12-01 DIAGNOSIS — K5792 Diverticulitis of intestine, part unspecified, without perforation or abscess without bleeding: Secondary | ICD-10-CM | POA: Insufficient documentation

## 2015-12-01 DIAGNOSIS — J302 Other seasonal allergic rhinitis: Secondary | ICD-10-CM | POA: Insufficient documentation

## 2015-12-01 DIAGNOSIS — I10 Essential (primary) hypertension: Secondary | ICD-10-CM | POA: Insufficient documentation

## 2015-12-04 ENCOUNTER — Ambulatory Visit (INDEPENDENT_AMBULATORY_CARE_PROVIDER_SITE_OTHER): Payer: 59 | Admitting: Internal Medicine

## 2015-12-04 ENCOUNTER — Encounter: Payer: Self-pay | Admitting: Internal Medicine

## 2015-12-04 VITALS — BP 142/78 | HR 60 | Ht 64.5 in | Wt 192.1 lb

## 2015-12-04 DIAGNOSIS — Z8249 Family history of ischemic heart disease and other diseases of the circulatory system: Secondary | ICD-10-CM | POA: Diagnosis not present

## 2015-12-04 NOTE — Patient Instructions (Signed)
Your physician recommends that you continue on your current medications as directed. Please refer to the Current Medication list given to you today. Your physician recommends that you schedule a follow-up appointment in: as needed with Dr. Ross.   

## 2015-12-04 NOTE — Progress Notes (Signed)
Cardiology Office Note   Date:  12/04/2015   ID:  Natasha Wilkinson, DOB [birth year], MRN [id number]  PCP:  Johny Blamer, MD  Cardiologist:   Dietrich Pates, MD    Pt referred by Dossie Arbour for cardiac risk eval     History of Present Illness: Natasha Wilkinson is a 55 y.o. female with a history of HTN and HL  She is folowd by Dossie Arbour. FHx is signif for vascular dz on monthers side of family  The pt denies CP  Breathing is OK  Does not exercise regularly  IS active though during day       Outpatient Prescriptions Prior to Visit  Medication Sig Dispense Refill  . Docusate Calcium (STOOL SOFTENER PO) Take 1 capsule by mouth daily.    . famotidine (PEPCID) 20 MG tablet Take 20 mg by mouth daily.    . fexofenadine (ALLEGRA) 180 MG tablet Take 180 mg by mouth daily.    . Fluticasone Furoate (ARNUITY ELLIPTA) 100 MCG/ACT AEPB Inhale 1 puff into the lungs daily. 30 each 5  . metoprolol succinate (TOPROL-XL) 50 MG 24 hr tablet Take 50 mg by mouth daily with breakfast.  0  . Multiple Vitamin (MULTIVITAMIN) capsule Take 1 capsule by mouth daily.    . Probiotic Product (ALIGN) 4 MG CAPS Take 4 mg by mouth daily.    . ranitidine (ZANTAC) 150 MG tablet Take 150 mg by mouth daily. Reported on 10/15/2015    . VENTOLIN HFA 108 (90 Base) MCG/ACT inhaler Inhale 2 puffs into the lungs every 4 (four) hours as needed. 1 Inhaler 5  . ALPRAZolam (XANAX) 0.5 MG tablet Take 1 tablet by mouth 1 hour prior to procedure (Patient not taking: Reported on 12/04/2015) 2 tablet 0  . ciprofloxacin (CIPRO) 500 MG tablet Take 500 mg by mouth 2 (two) times daily. Reported on 12/04/2015    . metroNIDAZOLE (FLAGYL) 500 MG tablet Take 500 mg by mouth 2 (two) times daily. Reported on 12/04/2015    . Wheat Dextrin (BENEFIBER DRINK MIX PO) Reported on 12/04/2015     No facility-administered medications prior to visit.     Allergies:   Peanut-containing drug products; Losartan; Norvasc; and Sulfa antibiotics   Past Medical  History  Diagnosis Date  . Hypertension   . Hypercholesteremia   . Seasonal allergies   . Obesity   . Lichenoid dermatitis   . Diverticulitis   . Hemorrhoid     Past Surgical History  Procedure Laterality Date  . Abdominal hysterectomy  2015  . Cholecystectomy  2005  . Colonoscopy  2016    incomplete  . Nasal sinus surgery  1991     Social History:  The patient  reports that she has never smoked. She does not have any smokeless tobacco history on file. She reports that she drinks alcohol. She reports that she does not use illicit drugs.   Family History:  The patient's family history includes Aortic aneurysm in her mother; Colon polyps in her father and sister; Heart disease in her mother; Hypertension in her mother; Prostate cancer in her father.    ROS:  Please see the history of present illness. All other systems are reviewed and  Negative to the above problem except as noted.    PHYSICAL EXAM: VS:  BP 142/78 mmHg  Pulse 60  Ht 5' 4.5" (1.638 m)  Wt 192 lb 1.9 oz (87.145 kg)  BMI 32.48 kg/m2  GEN: Well nourished, well developed, in  no acute distress HEENT: normal Neck: no JVD, carotid bruits, or masses Cardiac: RRR; no murmurs, rubs, or gallops,no edema  Respiratory:  clear to auscultation bilaterally, normal work of breathing GI: soft, nontender, nondistended, + BS  No hepatomegaly  MS: no deformity Moving all extremities   Skin: warm and dry, no rash Neuro:  Strength and sensation are intact Psych: euthymic mood, full affect   EKG:  EKG is ordered today.  SB 58 bpm     Lipid Panel No results found for: CHOL, TRIG, HDL, CHOLHDL, VLDL, LDLCALC, LDLDIRECT    Wt Readings from Last 3 Encounters:  12/04/15 192 lb 1.9 oz (87.145 kg)  11/05/15 190 lb (86.183 kg)  10/15/15 195 lb 6.4 oz (88.633 kg)      ASSESSMENT AND PLAN:  1.  Cardiac risk  Pt with history of HTN  Fairly well controlled.  Recent CT scan showed no evidence on atherosclerotic plaquing  Of  aorta or coronary arteries  Will review recent lipids from Dr Tiburcio Pea' office For now I think she demonstrates low risk  I encouraged her to increase her physcial activity to help with BP, again control cardiac risks    2  HTN  BP is better outside  I would keep on same meds   Encouraged increased physcial activity  Pt is due to have f/u CT for nodules  I will review when done   F/U will be prn for now     Signed, Dietrich Pates, MD  12/04/2015 8:29 AM    [facility] [address] [place], [place], [place]  [address] Phone: [phone]; Fax: [phone]

## 2016-02-03 ENCOUNTER — Emergency Department (HOSPITAL_BASED_OUTPATIENT_CLINIC_OR_DEPARTMENT_OTHER)
Admission: EM | Admit: 2016-02-03 | Discharge: 2016-02-03 | Disposition: A | Discharge status: Home / Self Care | Payer: 59 | Attending: Emergency Medicine | Admitting: Emergency Medicine

## 2016-02-03 ENCOUNTER — Emergency Department (HOSPITAL_BASED_OUTPATIENT_CLINIC_OR_DEPARTMENT_OTHER): Payer: 59

## 2016-02-03 ENCOUNTER — Encounter (HOSPITAL_BASED_OUTPATIENT_CLINIC_OR_DEPARTMENT_OTHER): Payer: Self-pay | Admitting: *Deleted

## 2016-02-03 DIAGNOSIS — R1032 Left lower quadrant pain: Secondary | ICD-10-CM | POA: Diagnosis present

## 2016-02-03 DIAGNOSIS — E7800 Pure hypercholesterolemia, unspecified: Secondary | ICD-10-CM | POA: Diagnosis not present

## 2016-02-03 DIAGNOSIS — K5792 Diverticulitis of intestine, part unspecified, without perforation or abscess without bleeding: Secondary | ICD-10-CM | POA: Insufficient documentation

## 2016-02-03 DIAGNOSIS — E669 Obesity, unspecified: Secondary | ICD-10-CM | POA: Insufficient documentation

## 2016-02-03 DIAGNOSIS — I10 Essential (primary) hypertension: Secondary | ICD-10-CM | POA: Insufficient documentation

## 2016-02-03 DIAGNOSIS — Z6830 Body mass index (BMI) 30.0-30.9, adult: Secondary | ICD-10-CM | POA: Diagnosis not present

## 2016-02-03 DIAGNOSIS — Z79899 Other long term (current) drug therapy: Secondary | ICD-10-CM | POA: Diagnosis not present

## 2016-02-03 LAB — CBC WITH DIFFERENTIAL/PLATELET
Basophils Absolute: 0 10*3/uL (ref 0.0–0.1)
Basophils Relative: 0 %
Eosinophils Absolute: 0.2 10*3/uL (ref 0.0–0.7)
Eosinophils Relative: 6 %
HCT: 35 % — ABNORMAL LOW (ref 36.0–46.0)
Hemoglobin: 11.6 g/dL — ABNORMAL LOW (ref 12.0–15.0)
Lymphocytes Relative: 21 %
Lymphs Abs: 0.7 10*3/uL (ref 0.7–4.0)
MCH: 28.8 pg (ref 26.0–34.0)
MCHC: 33.1 g/dL (ref 30.0–36.0)
MCV: 86.8 fL (ref 78.0–100.0)
Monocytes Absolute: 0.6 10*3/uL (ref 0.1–1.0)
Monocytes Relative: 18 %
Neutro Abs: 1.8 10*3/uL (ref 1.7–7.7)
Neutrophils Relative %: 55 %
Platelets: 231 10*3/uL (ref 150–400)
RBC: 4.03 MIL/uL (ref 3.87–5.11)
RDW: 14.7 % (ref 11.5–15.5)
WBC: 3.2 10*3/uL — ABNORMAL LOW (ref 4.0–10.5)

## 2016-02-03 LAB — COMPREHENSIVE METABOLIC PANEL
ALT: 25 U/L (ref 14–54)
AST: 30 U/L (ref 15–41)
Albumin: 3.8 g/dL (ref 3.5–5.0)
Alkaline Phosphatase: 69 U/L (ref 38–126)
Anion gap: 7 (ref 5–15)
BUN: 6 mg/dL (ref 6–20)
CO2: 27 mmol/L (ref 22–32)
Calcium: 9.2 mg/dL (ref 8.9–10.3)
Chloride: 107 mmol/L (ref 101–111)
Creatinine, Ser: 0.56 mg/dL (ref 0.44–1.00)
GFR calc Af Amer: >60 mL/min (ref >60)
GFR calc non Af Amer: >60 mL/min (ref >60)
Glucose, Bld: 93 mg/dL (ref 65–99)
Potassium: 3.8 mmol/L (ref 3.5–5.1)
Sodium: 141 mmol/L (ref 135–145)
Total Bilirubin: 0.9 mg/dL (ref 0.3–1.2)
Total Protein: 7.7 g/dL (ref 6.5–8.1)

## 2016-02-03 LAB — URINALYSIS, ROUTINE W REFLEX MICROSCOPIC
Bilirubin Urine: NEGATIVE
Glucose, UA: NEGATIVE mg/dL
Hgb urine dipstick: NEGATIVE
Ketones, ur: NEGATIVE mg/dL
Leukocytes, UA: NEGATIVE
Nitrite: NEGATIVE
Protein, ur: NEGATIVE mg/dL
Specific Gravity, Urine: 1.007 (ref 1.005–1.030)
pH: 7 (ref 5.0–8.0)

## 2016-02-03 MED ORDER — IOPAMIDOL (ISOVUE-300) INJECTION 61%
100.0000 mL | Freq: Once | INTRAVENOUS | Status: AC | PRN
  Administered 2016-02-03: 100 mL via INTRAVENOUS

## 2016-02-03 MED ORDER — CIPROFLOXACIN HCL 500 MG PO TABS: 500.0000 mg | ORAL_TABLET | Freq: Two times a day (BID) | ORAL | Status: DC

## 2016-02-03 MED ORDER — MORPHINE SULFATE 15 MG PO TABS
15.0000 mg | ORAL_TABLET | Freq: Four times a day (QID) | ORAL | Status: DC | PRN
Start: 2016-02-03 — End: 2016-05-07

## 2016-02-03 MED ORDER — METRONIDAZOLE 500 MG PO TABS: 500.0000 mg | ORAL_TABLET | Freq: Three times a day (TID) | ORAL | Status: DC

## 2016-02-03 NOTE — ED Notes (Signed)
Pt verbalizes understanding of d/c instructions and denies any further needs at this time. 

## 2016-02-03 NOTE — ED Notes (Signed)
Left lower abdominal pain. Hx of diverticulitis. She treated herself with Cipro and Flagyl June 17th when symptoms started. Increased soft stools over the past 3 days.

## 2016-02-03 NOTE — Discharge Instructions (Signed)
Diverticulitis °Diverticulitis is inflammation or infection of small pouches in your colon that form when you have a condition called diverticulosis. The pouches in your colon are called diverticula. Your colon, or large intestine, is where water is absorbed and stool is formed. °Complications of diverticulitis can include: °· Bleeding. °· Severe infection. °· Severe pain. °· Perforation of your colon. °· Obstruction of your colon. °CAUSES  °Diverticulitis is caused by bacteria. °Diverticulitis happens when stool becomes trapped in diverticula. This allows bacteria to grow in the diverticula, which can lead to inflammation and infection. °RISK FACTORS °People with diverticulosis are at risk for diverticulitis. Eating a diet that does not include enough fiber from fruits and vegetables may make diverticulitis more likely to develop. °SYMPTOMS  °Symptoms of diverticulitis may include: °· Abdominal pain and tenderness. The pain is normally located on the left side of the abdomen, but may occur in other areas. °· Fever and chills. °· Bloating. °· Cramping. °· Nausea. °· Vomiting. °· Constipation. °· Diarrhea. °· Blood in your stool. °DIAGNOSIS  °Your health care provider will ask you about your medical history and do a physical exam. You may need to have tests done because many medical conditions can cause the same symptoms as diverticulitis. Tests may include: °· Blood tests. °· Urine tests. °· Imaging tests of the abdomen, including X-rays and CT scans. °When your condition is under control, your health care provider may recommend that you have a colonoscopy. A colonoscopy can show how severe your diverticula are and whether something else is causing your symptoms. °TREATMENT  °Most cases of diverticulitis are mild and can be treated at home. Treatment may include: °· Taking over-the-counter pain medicines. °· Following a clear liquid diet. °· Taking antibiotic medicines by mouth for 7-10 days. °More severe cases may  be treated at a hospital. Treatment may include: °· Not eating or drinking. °· Taking prescription pain medicine. °· Receiving antibiotic medicines through an IV tube. °· Receiving fluids and nutrition through an IV tube. °· Surgery. °HOME CARE INSTRUCTIONS  °· Follow your health care provider's instructions carefully. °· Follow a full liquid diet or other diet as directed by your health care provider. After your symptoms improve, your health care provider may tell you to change your diet. He or she may recommend you eat a high-fiber diet. Fruits and vegetables are good sources of fiber. Fiber makes it easier to pass stool. °· Take fiber supplements or probiotics as directed by your health care provider. °· Only take medicines as directed by your health care provider. °· Keep all your follow-up appointments. °SEEK MEDICAL CARE IF:  °· Your pain does not improve. °· You have a hard time eating food. °· Your bowel movements do not return to normal. °SEEK IMMEDIATE MEDICAL CARE IF:  °· Your pain becomes worse. °· Your symptoms do not get better. °· Your symptoms suddenly get worse. °· You have a fever. °· You have repeated vomiting. °· You have bloody or black, tarry stools. °MAKE SURE YOU:  °· Understand these instructions. °· Will watch your condition. °· Will get help right away if you are not doing well or get worse. °  °This information is not intended to replace advice given to you by your health care provider. Make sure you discuss any questions you have with your health care provider. °  °Document Released: 04/28/2005 Document Revised: 07/24/2013 Document Reviewed: 06/13/2013 °Elsevier Interactive Patient Education ©2016 [employer] ° °

## 2016-02-03 NOTE — ED Provider Notes (Signed)
CSN: [id number]     Arrival date & time 02/03/16  1518 History  By signing my name below, I, [name], attest that this documentation has been prepared under the direction and in the presence of Lyndal Pulley, MD. Electronically Signed: [name], ED Scribe. 02/03/2016. 4:25 PM.   No chief complaint on file.   The history is provided by the patient. No language interpreter was used.   HPI Comments: [name] is a 55 y.o. female with Diverticulitis who presents to the Emergency Department complaining of intermittent LLQ abdominal pain onset 1 week. Pt states she was prescribed antibiotics for clinical Diverticulitis on 01/17/2016 and finished 01/26/2016. Pt states abdominal pain returned 5 days after completing antibiotics. Pt reports associated nausea, softer than baseline bowel movements, and increased bowel movement frequency in the mornings. Pt denies fever, vomiting, diarrhea, blood in stool.   Past Medical History  Diagnosis Date  . Hypertension   . Hypercholesteremia   . Seasonal allergies   . Obesity   . Lichenoid dermatitis   . Diverticulitis   . Hemorrhoid    Past Surgical History  Procedure Laterality Date  . Abdominal hysterectomy  2015  . Cholecystectomy  2005  . Colonoscopy  2016    incomplete  . Nasal sinus surgery  1991   Family History  Problem Relation Age of Onset  . Colon polyps Father   . Prostate cancer Father   . Aortic aneurysm Mother   . Hypertension Mother   . Colon polyps Sister   . Heart disease Mother    Social History  Substance Use Topics  . Smoking status: Never Smoker   . Smokeless tobacco: None  . Alcohol Use: 0.0 oz/week    0 Standard drinks or equivalent per week     Comment: rare   OB History    No data available     Review of Systems  Constitutional: Negative for fever.  Gastrointestinal: Positive for nausea and abdominal pain. Negative for vomiting and blood in stool.  All other systems reviewed and are  negative.   Allergies  Peanut-containing drug products; Losartan; Norvasc; and Sulfa antibiotics  Home Medications   Prior to Admission medications   Medication Sig Start Date End Date Taking? Authorizing Provider  Docusate Calcium (STOOL SOFTENER PO) Take 1 capsule by mouth daily.   Yes Historical Provider, MD  famotidine (PEPCID) 20 MG tablet Take 20 mg by mouth daily.   Yes Historical Provider, MD  fexofenadine (ALLEGRA) 180 MG tablet Take 180 mg by mouth daily.   Yes Historical Provider, MD  Fluticasone Furoate (ARNUITY ELLIPTA) 100 MCG/ACT AEPB Inhale 1 puff into the lungs daily. 11/21/15  Yes Praveen Mannam, MD  metoprolol succinate (TOPROL-XL) 50 MG 24 hr tablet Take 50 mg by mouth daily with breakfast. 02/14/15  Yes Historical Provider, MD  Multiple Vitamin (MULTIVITAMIN) capsule Take 1 capsule by mouth daily.   Yes Historical Provider, MD  Probiotic Product (ALIGN) 4 MG CAPS Take 4 mg by mouth daily.   Yes Historical Provider, MD  ranitidine (ZANTAC) 150 MG tablet Take 150 mg by mouth daily. Reported on 10/15/2015   Yes Historical Provider, MD  VENTOLIN HFA 108 (90 Base) MCG/ACT inhaler Inhale 2 puffs into the lungs every 4 (four) hours as needed. 11/11/15   Praveen Mannam, MD   BP 183/105 mmHg  Pulse 74  Temp(Src) 98 F (36.7 C) (Oral)  Resp 18  Ht 5\' 7"  (1.702 m)  Wt 192 lb (87.091 kg)  BMI 30.06 kg/m2  SpO2 100% Physical Exam  Constitutional: She appears well-developed and well-nourished.  HENT:  Head: Normocephalic and atraumatic.  Eyes: Conjunctivae are normal.  Cardiovascular: Normal rate, regular rhythm and normal heart sounds.   Pulmonary/Chest: Effort normal and breath sounds normal. No respiratory distress. She has no wheezes. She has no rales.  Abdominal: She exhibits no distension. There is tenderness. There is no rebound.  LLQ tenderness with voluntary guarding   Musculoskeletal: Normal range of motion.  Neurological: She is alert.  Skin: Skin is warm and  dry.  Psychiatric: She has a normal mood and affect. Her behavior is normal.  Nursing note and vitals reviewed.   ED Course  Procedures (including critical care time)  DIAGNOSTIC STUDIES: Oxygen Saturation is 97% on RA, normal by my interpretation.    COORDINATION OF CARE: 4:03 PM Discussed treatment plan with pt at bedside which includes CT Abdomen Pelvis with contrast and pt agreed to plan.  Labs Review Labs Reviewed  CBC WITH DIFFERENTIAL/PLATELET - Abnormal; Notable for the following:    WBC 3.2 (*)    Hemoglobin 11.6 (*)    HCT 35.0 (*)    All other components within normal limits  URINALYSIS, ROUTINE W REFLEX MICROSCOPIC (NOT AT [facility])  COMPREHENSIVE METABOLIC PANEL    Imaging Review Ct Abdomen Pelvis W Contrast  02/03/2016  CLINICAL DATA:  Left lower quadrant pain for several days. Nausea. History of diverticulitis. EXAM: CT ABDOMEN AND PELVIS WITH CONTRAST TECHNIQUE: Multidetector CT imaging of the abdomen and pelvis was performed using the standard protocol following bolus administration of intravenous contrast. CONTRAST:  ISOVUE-300 IOPAMIDOL (ISOVUE-300) INJECTION 61% COMPARISON:  CT abdomen dated 03/09/2015. FINDINGS: Lower chest: Stable 13 mm nodule within the right lower lobe posteriorly (series 3, image 7). Also stable 5 mm subpleural nodule within the right lower lobe laterally (image 5) and stable 3 mm subpleural nodule within the left lower lobe laterally (image 6). No new findings at either lung base. Hepatobiliary: Status post cholecystectomy.  Liver appears normal. Pancreas: No mass, inflammatory changes, or other significant abnormality. Spleen: Within normal limits in size and appearance. Adrenals/Urinary Tract: 5 mm nonobstructing right renal stone. Left kidney is unremarkable without stone or hydronephrosis. No ureteral or bladder calculi identified. Bladder is decompressed. Stomach/Bowel: There is thickening of the walls of the upper sigmoid colon. Scattered  diverticulosis is noted within the sigmoid and descending colon. No large bowel or small bowel dilatation. Vascular/Lymphatic: No pathologically enlarged lymph nodes. No evidence of abdominal aortic aneurysm. Reproductive: No mass or other significant abnormality. Other: No abscess collection.  No free intraperitoneal air. Musculoskeletal: Stable periumbilical abdominal wall hernia which contains fat only. Superficial soft tissues are otherwise unremarkable. Mild degenerative change within the thoracolumbar spine. No acute or suspicious osseous lesion. IMPRESSION: 1. Fairly severe thickening of the walls of the upper sigmoid colon, almost certainly representing an acute diverticulitis, less likely colitis. There is associated paracolic fluid stranding/inflammation. No abscess collection seen. No free intraperitoneal air. No associated bowel obstruction. 2. Multiple pulmonary nodules at the bilateral lung bases, largest of which measures 13 mm in the right lower lobe, all of which appear stable compared to the previous CT of 03/09/2015. The stability for 1 year is reassuring and the benign appearance on interval PET-CT are also reassuring. Would consider additional follow-up chest CT in 6-12 months to ensure continued stability. 3. Additional chronic/incidental findings detailed above. Electronically Signed   By: Bary Richard M.D.   On: 02/03/2016 19:17  I have personally reviewed and evaluated these images and lab results as part of my medical decision-making.   EKG Interpretation None      MDM   Final diagnoses:  Left lower quadrant pain  Acute diverticulitis    55 year old female with history of diverticulitis presents after completing a course of antibiotics recently and having recurrent left lower quadrant pain. Has had loose stools without hematochezia or other complaint features. No fevers, well-appearing. Discussed imaging versus conservative management with ongoing antibiotics and patient  opted for definitive diagnosis to rule out abscess, perforation or other complication in setting of failure of outpatient management.  Ongoing wall thickening of upper sigmoid colon is consistent with recurrent diverticulitis. As she failed a short course of outpatient antibiotics we will extend her out to a full 2 week course from today, I recommended NSAIDs and Tylenol for pain as she appears fairly comfortable and she was given morphine sulfate IR tabs for breakthrough pain. With her recurrent episodes of diverticulitis she would be a likely candidate for partial sigmoidectomy and I recommended she discuss referral to general surgery with her primary care physician. Return precautions were discussed for worsening or new concerning symptoms.  I personally performed the services described in this documentation, which was scribed in my presence. The recorded information has been reviewed and is accurate.     Lyndal Pulley, MD 02/03/16 [phone]

## 2016-02-03 NOTE — ED Notes (Signed)
Pt reports intermittent L lower abd pain x 2 weeks, occassional  nausea, denies vomiting. Also reports she has noticed some white vaginal discharge.

## 2016-04-02 ENCOUNTER — Telehealth: Payer: Self-pay | Admitting: Pulmonary Disease

## 2016-04-02 NOTE — Telephone Encounter (Signed)
Pt is aware that we do not have any samples/cards at this time.

## 2016-05-07 ENCOUNTER — Encounter: Payer: Self-pay | Admitting: Pulmonary Disease

## 2016-05-07 ENCOUNTER — Ambulatory Visit (INDEPENDENT_AMBULATORY_CARE_PROVIDER_SITE_OTHER): Payer: 59 | Admitting: Pulmonary Disease

## 2016-05-07 VITALS — BP 134/92 | HR 76 | Ht 67.0 in | Wt 181.6 lb

## 2016-05-07 DIAGNOSIS — R918 Other nonspecific abnormal finding of lung field: Secondary | ICD-10-CM

## 2016-05-07 NOTE — Patient Instructions (Addendum)
Continue your inhalers as prescribed. We will schedule you for a follow up CT without contrast in Feb 2018.  Return in 6 months

## 2016-05-07 NOTE — Progress Notes (Addendum)
Natasha Wilkinson    [id number]    08/05/1960  Primary Care Physician:HARRIS, Gwyndolyn Saxon, MD  Referring Physician: Shirline Frees, MD [address], [place] [address]  Chief complaint:   Follow up for lung nodules.  HPI: Mrs. Natasha Wilkinson is a 55 year old with past medical history as below. She underwent a CT of the abdomen to evaluate for diverticulitis in 2016. This showed basal pulmonary nodules. She had a follow-up CT scan last month which confirmed multiple pulmonary nodules. The basal nodule has been stable for the past 6 months. She's been sent to pulmonary clinic for further evaluation.  She is up-to-date with her cancer screening. She's had a colonoscopy last year which was incomplete due to difficulty passing the scope. She's currently being evaluated with stool cards and a repeat scope planned in 5 year. She's had a hysterectomy in 2015 for fibroids and uterine bleeding. She lived in [place] all her life with no travel except for a cruise to the [place] a few months ago. She's had no significant exposure to TB or other factors.  She does not increase in dyspnea, wheezing or the past few days. She's been using her albuterol inhaler with relief in symptoms. Results of the PET scan reviewed with her. She has followed up with the primary care physician for the findings of diverticulitis and is now on Cipro and Flagyl.   Outpatient Encounter Prescriptions as of 05/07/2016  Medication Sig  . famotidine (PEPCID) 20 MG tablet Take 20 mg by mouth daily.  . fexofenadine (ALLEGRA) 180 MG tablet Take 180 mg by mouth daily.  . Fluticasone Furoate (ARNUITY ELLIPTA) 100 MCG/ACT AEPB Inhale 1 puff into the lungs daily.  . metoprolol succinate (TOPROL-XL) 50 MG 24 hr tablet Take 50 mg by mouth daily with breakfast.  . Multiple Vitamin (MULTIVITAMIN) capsule Take 1 capsule by mouth daily.  . [DISCONTINUED] Probiotic Product (ALIGN) 4 MG CAPS Take 4 mg by mouth daily.  Natasha Wilkinson Calcium (STOOL SOFTENER PO) Take 1 capsule by mouth daily.  . VENTOLIN HFA 108 (90 Base) MCG/ACT inhaler Inhale 2 puffs into the lungs every 4 (four) hours as needed.  . [DISCONTINUED] ciprofloxacin (CIPRO) 500 MG tablet Take 1 tablet (500 mg total) by mouth 2 (two) times daily.  . [DISCONTINUED] metroNIDAZOLE (FLAGYL) 500 MG tablet Take 1 tablet (500 mg total) by mouth 3 (three) times daily.  . [DISCONTINUED] morphine (MSIR) 15 MG tablet Take 1 tablet (15 mg total) by mouth every 6 (six) hours as needed for severe pain.  . [DISCONTINUED] ranitidine (ZANTAC) 150 MG tablet Take 150 mg by mouth daily. Reported on 10/15/2015   No facility-administered encounter medications on file as of 05/07/2016.     Allergies as of 05/07/2016 - Review Complete 05/07/2016  Allergen Reaction Noted  . Peanut-containing drug products Anaphylaxis, Itching, and Swelling 03/09/2015  . Ciprofloxacin    . Losartan Rash 10/14/2015  . Norvasc [amlodipine besylate] Rash 10/14/2015  . Sulfa antibiotics Rash 03/09/2015    Past Medical History:  Diagnosis Date  . Diverticulitis   . Hemorrhoid   . Hypercholesteremia   . Hypertension   . Lichenoid dermatitis   . Obesity   . Seasonal allergies     Past Surgical History:  Procedure Laterality Date  . ABDOMINAL HYSTERECTOMY  2015  . CHOLECYSTECTOMY  2005  . COLONOSCOPY  2016   incomplete  . NASAL SINUS SURGERY  1991    Family History  Problem Relation Age of Onset  . Colon polyps Father   . Prostate cancer Father   . Aortic aneurysm Mother   . Hypertension Mother   . Colon polyps Sister   . Heart disease Mother     Social History   Social History  . Marital status: Married    Spouse name: N/A  . Number of children: N/A  . Years of education: N/A   Occupational History  . [employer]    Social History Main Topics  . Smoking status: Never Smoker  . Smokeless tobacco: Not on file  . Alcohol use 0.0 oz/week     Comment:  rare  . Drug use: No  . Sexual activity: Not on file   Other Topics Concern  . 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  HENT: Negative.   Eyes: Negative for blurred vision.  Respiratory: as per HPI  Cardiovascular: Negative for chest pain and palpitations.  Gastrointestinal: Negative for vomiting, diarrhea, blood per rectum. Genitourinary: Negative for dysuria, urgency, frequency and hematuria.  Musculoskeletal: Negative for myalgias, back pain and joint pain.  Skin: Negative for itching and rash.  Neurological: Negative for dizziness, tremors, focal weakness, seizures and loss of consciousness.  Endo/Heme/Allergies: Negative for environmental allergies.  Psychiatric/Behavioral: Negative for depression, suicidal ideas and hallucinations.  All other systems reviewed and are negative.   Physical Exam: Blood pressure (!) 134/92, pulse 76, height _0  (1.702 m), weight 181 lb 9.6 oz (82.4 kg), SpO2 98 %. Gen:      No acute distress HEENT:  EOMI, sclera anicteric Neck:     No masses; no thyromegaly Lungs:    Clear to auscultation bilaterally; normal respiratory effort CV:         Regular rate and rhythm; no murmurs Abd:      + bowel sounds; soft, non-tender; no palpable masses, no distension Ext:    No edema; adequate peripheral perfusion Skin:      Warm and dry; no rash Neuro: alert and oriented x 3 Psych: normal mood and affect  Data Reviewed: Pap smear 04/10/15 - Negative for malignancy Endometrial dilatation and curretage, hysteroscopy 11/18/09 - Negative for malignancy. Colonoscopy   CT abd 03/09/15 Acute diverticulitis, hiatal hernia, multiple pulmonary nodules.  CT chest 09/12/15 Multiple pulmonary nodules. Largest one in the right lower lobe.  Stable since CT of the abdomen from 2016.  10/15/15 Balso, Histo, cocci ab - Negative ESR 35 ANA- Neg Rheumatoid factor < 10 CCP > 250 c- ANCA 1:160  PET scan chest 10/23/15 Negative activity in pulmonary nodules, diverticulosis, renal stone, umbilical hernia, hepatic steatosis.   Assessment:  Eval for Multiple pulmonary nodules.  She's had a negative PET scan which is reassuring although it does not completely rule out malignancy Differential diagnosis includes noninfectious process such as rheumatoid arthritis, connective tissue disease, Wegners etc. Serologies for fungal disease such as cocci, blasto, histo is negative. She is healthy otherwise with no evidence of immunocompromise, travel history that may predispose to fungal, opportunistic infections. Her serologies positive for CCP and ANCA. She has seen Dr. Trudie Reed of Rheumatology but is not on treatment.   Wheezing has improved on the arnuity.  Plan/Recommendations: - Continue  arnuity - Follow up CT of chest in 3-4 months.  Marshell Garfinkel MD [facility] Pager [phone] 05/07/2016, 2:43 PM  CC: Shirline Frees, MD  Addendum: Received office visit note from [facility] rheumatology. Dated 03/31/16 Dr. Trudie Reed believes that she has rheumatoid arthritis. Repeat ANCA test is negative. She is being monitored for her joint pain with no plan to start treatment for rheumatoid arthritis unless she develops significant joint symptoms. Rheumatoid nodules on own did not require immunosuppression. Note scanned.

## 2016-07-08 ENCOUNTER — Encounter: Payer: Self-pay | Admitting: Internal Medicine

## 2016-07-09 ENCOUNTER — Telehealth: Payer: Self-pay | Admitting: Internal Medicine

## 2016-07-09 NOTE — Telephone Encounter (Signed)
Called patient she will come 12/11 at 2pm.  Asked her to bring medicine bottles with her to record doses.

## 2016-07-09 NOTE — Telephone Encounter (Signed)
BP in GI earlier this week was  160/100  Then 180/100s    In IM was 178/113   Given 2 pills  BP came down a little  Placed on lisinopril/HCTZ Started yesterday along with metoprolol  BP was 138/86 today AM  147/93 before meds  Now 168/102.   Feeling OK  Maybe a little draggy    Will call back with dosing   Had Rxn to Losartan and amlodipine   (rash)  Told pt to call back with dose WIll try to arrange appt for her with me in clinic on Monday for BP check and labs

## 2016-07-11 NOTE — Progress Notes (Signed)
Cardiology Office Note   Date:  07/12/2016   ID:  Natasha Wilkinson, DOB 1961-07-26, MRN [id number]  PCP:  Natasha Blamer, MD  Cardiologist:   Natasha Pates, MD   Pt presents for f/u of HTN     History of Present Illness: Natasha Wilkinson is a 55 y.o. female with a history of HTN and HL  I saw hier in May   She called last week relaying info about BP being elevated    Seen in GI clinic last week  BP high  160/100  Then 180/100s    Later in IM clinc BP was 178/113   Given 2 pills  BP came down a little  (not sure what she was given)  Placed on lisinopril/HCTZ Continues on  metoprolol  BP was 138/86 today AM  147/93 before meds  Now 168/102.   Feeling OK  Maybe a little draggy    Will call back with dosing   Had Rxn to Losartan and amlodipine   (rash)  Told pt to call back with dose WIll try to arrange appt for her with me in clinic on Monday for BP check and labs        Outpatient Medications Prior to Visit  Medication Sig Dispense Refill  . famotidine (PEPCID) 20 MG tablet Take 20 mg by mouth daily.    . fexofenadine (ALLEGRA) 180 MG tablet Take 180 mg by mouth daily.    . Fluticasone Furoate (ARNUITY ELLIPTA) 100 MCG/ACT AEPB Inhale 1 puff into the lungs daily. 30 each 5  . metoprolol succinate (TOPROL-XL) 50 MG 24 hr tablet Take 50 mg by mouth daily with breakfast.  0  . Multiple Vitamin (MULTIVITAMIN) capsule Take 1 capsule by mouth daily.    Tery Sanfilippo Calcium (STOOL SOFTENER PO) Take 1 capsule by mouth daily.    . VENTOLIN HFA 108 (90 Base) MCG/ACT inhaler Inhale 2 puffs into the lungs every 4 (four) hours as needed. (Patient not taking: Reported on 07/12/2016) 1 Inhaler 5   No facility-administered medications prior to visit.      Allergies:   Peanut-containing drug products; Ciprofloxacin; Losartan; Norvasc [amlodipine besylate]; and Sulfa antibiotics   Past Medical History:  Diagnosis Date  . Diverticulitis   . Hemorrhoid   .  Hypercholesteremia   . Hypertension   . Lichenoid dermatitis   . Obesity   . Seasonal allergies     Past Surgical History:  Procedure Laterality Date  . ABDOMINAL HYSTERECTOMY  2015  . CHOLECYSTECTOMY  2005  . COLONOSCOPY  2016   incomplete  . NASAL SINUS SURGERY  1991     Social History:  The patient  reports that she has never smoked. She has never used smokeless tobacco. She reports that she drinks alcohol. She reports that she does not use drugs.   Family History:  The patient's family history includes Aortic aneurysm in her mother; Colon polyps in her father and sister; Heart disease in her mother; Hypertension in her mother; Prostate cancer in her father.    ROS:  Please see the history of present illness. All other systems are reviewed and  Negative to the above problem except as noted.    PHYSICAL EXAM: VS:  BP (!) 148/88   Pulse 74   Ht 5\' 7"  (1.702 m)   Wt 185 lb 12.8 oz (84.3 kg)   SpO2 97%   BMI 29.10 kg/m   GEN: Well nourished, well developed, in no acute distress  HEENT:  normal  Neck: no JVD, carotid bruits, or masses Cardiac: RRR; no murmurs, rubs, or gallops,no edema  Respiratory:  clear to auscultation bilaterally, normal work of breathing GI: soft, nontender, nondistended, + BS  No hepatomegaly  MS: no deformity Moving all extremities   Skin: warm and dry, no rash Neuro:  Strength and sensation are intact Psych: euthymic mood, full affect   EKG:  EKG is not ordered today.   Lipid Panel No results found for: CHOL, TRIG, HDL, CHOLHDL, VLDL, LDLCALC, LDLDIRECT    Wt Readings from Last 3 Encounters:  07/12/16 185 lb 12.8 oz (84.3 kg)  05/07/16 181 lb 9.6 oz (82.4 kg)  02/03/16 192 lb (87.1 kg)      ASSESSMENT AND PLAN:  1.  HTN  BP is better  Not optimal   She should continue to take meds  Communicate in the next couple weeks through my chart for fnal BP response    2.  ? Sleep apnea  Pt's husband says she snores a lot  Will set up for  sleep study  3.  HCM  Get lipids from primary MD    Will check CBC, TSH and BMET today   Current medicines are reviewed at length with the patient today.  The patient does not have concerns regarding medicines.  Signed, Natasha Pates, MD  07/12/2016 2:36 PM    [facility] [address] [place], [place], [place]  [address] Phone: [phone]; Fax: [phone]

## 2016-07-12 ENCOUNTER — Encounter (INDEPENDENT_AMBULATORY_CARE_PROVIDER_SITE_OTHER): Payer: Self-pay

## 2016-07-12 ENCOUNTER — Ambulatory Visit (INDEPENDENT_AMBULATORY_CARE_PROVIDER_SITE_OTHER): Payer: 59 | Admitting: Internal Medicine

## 2016-07-12 ENCOUNTER — Encounter: Payer: Self-pay | Admitting: Internal Medicine

## 2016-07-12 VITALS — BP 148/88 | HR 74 | Ht 67.0 in | Wt 185.8 lb

## 2016-07-12 DIAGNOSIS — I10 Essential (primary) hypertension: Secondary | ICD-10-CM | POA: Diagnosis not present

## 2016-07-12 LAB — CBC
HCT: 35.8 % (ref 35.0–45.0)
Hemoglobin: 11.8 g/dL (ref 11.7–15.5)
MCH: 27.8 pg (ref 27.0–33.0)
MCHC: 33 g/dL (ref 32.0–36.0)
MCV: 84.4 fL (ref 80.0–100.0)
MPV: 10.6 fL (ref 7.5–12.5)
Platelets: 209 10*3/uL (ref 140–400)
RBC: 4.24 MIL/uL (ref 3.80–5.10)
RDW: 15.2 % — ABNORMAL HIGH (ref 11.0–15.0)
WBC: 2.9 10*3/uL — ABNORMAL LOW (ref 3.8–10.8)

## 2016-07-12 LAB — TSH: TSH: 1.97 mIU/L

## 2016-07-12 NOTE — Patient Instructions (Signed)
Your physician recommends that you continue on your current medications as directed. Please refer to the Current Medication list given to you today.   Your physician recommends that you return for lab work in: today (cbc, tsh, bmet)  Your physician has recommended that you have a sleep study. This test records several body functions during sleep, including: brain activity, eye movement, oxygen and carbon dioxide blood levels, heart rate and rhythm, breathing rate and rhythm, the flow of air through your mouth and nose, snoring, body muscle movements, and chest and belly movement.

## 2016-07-13 LAB — BASIC METABOLIC PANEL
BUN: 12 mg/dL (ref 7–25)
CO2: 30 mmol/L (ref 20–31)
Calcium: 9.6 mg/dL (ref 8.6–10.4)
Chloride: 100 mmol/L (ref 98–110)
Creat: 0.66 mg/dL (ref 0.50–1.05)
Glucose, Bld: 93 mg/dL (ref 65–99)
Potassium: 4.1 mmol/L (ref 3.5–5.3)
Sodium: 138 mmol/L (ref 135–146)

## 2016-07-31 ENCOUNTER — Other Ambulatory Visit: Payer: Self-pay | Admitting: Pulmonary Disease

## 2016-08-03 ENCOUNTER — Other Ambulatory Visit: Payer: Self-pay

## 2016-08-03 MED ORDER — FLUTICASONE FUROATE 100 MCG/ACT IN AEPB: 1.0000 | INHALATION_SPRAY | Freq: Every day | RESPIRATORY_TRACT | 5 refills | Status: DC

## 2016-08-09 DIAGNOSIS — N200 Calculus of kidney: Secondary | ICD-10-CM | POA: Diagnosis not present

## 2016-08-09 DIAGNOSIS — N952 Postmenopausal atrophic vaginitis: Secondary | ICD-10-CM | POA: Diagnosis not present

## 2016-08-09 DIAGNOSIS — N3020 Other chronic cystitis without hematuria: Secondary | ICD-10-CM | POA: Diagnosis not present

## 2016-08-27 DIAGNOSIS — N200 Calculus of kidney: Secondary | ICD-10-CM | POA: Diagnosis not present

## 2016-08-30 ENCOUNTER — Telehealth: Payer: Self-pay | Admitting: Pulmonary Disease

## 2016-08-30 NOTE — Telephone Encounter (Signed)
I called the patient and left a message on her vm for her to return my call

## 2016-08-30 NOTE — Telephone Encounter (Signed)
-----   Message from [facility] sent at 08/27/2016  4:15 PM EST ----- Regarding: Denied In Lab Sleep Study for 09-01-16 Natasha Wilkinson  Pt's in lab sleep study at [facility] denied by [employer].  Please canx test, notify pt and set up for HST. No precert for HST.  Thanks  Charmaine

## 2016-08-30 NOTE — Telephone Encounter (Signed)
lmtcb x1 for pt. 

## 2016-08-31 DIAGNOSIS — R8271 Bacteriuria: Secondary | ICD-10-CM | POA: Diagnosis not present

## 2016-08-31 DIAGNOSIS — N200 Calculus of kidney: Secondary | ICD-10-CM | POA: Diagnosis not present

## 2016-08-31 DIAGNOSIS — N3020 Other chronic cystitis without hematuria: Secondary | ICD-10-CM | POA: Diagnosis not present

## 2016-09-01 ENCOUNTER — Encounter (HOSPITAL_BASED_OUTPATIENT_CLINIC_OR_DEPARTMENT_OTHER): Payer: 59

## 2016-09-07 ENCOUNTER — Ambulatory Visit (INDEPENDENT_AMBULATORY_CARE_PROVIDER_SITE_OTHER)
Admission: RE | Admit: 2016-09-07 | Discharge: 2016-09-07 | Disposition: A | Discharge status: Home / Self Care | Payer: 59 | Source: Ambulatory Visit | Attending: Pulmonary Disease | Admitting: Pulmonary Disease

## 2016-09-07 DIAGNOSIS — R918 Other nonspecific abnormal finding of lung field: Secondary | ICD-10-CM

## 2016-09-12 ENCOUNTER — Encounter: Payer: Self-pay | Admitting: Pulmonary Disease

## 2016-09-12 NOTE — Progress Notes (Signed)
Received fax from [facility] specialist, [facility] CT scan abdomen pelvis 08/27/16 Indication-recurrent cystitis, dysuria, renal stones  Findings: Stable bilateral pulmonary nodules measuring up to 11 mm in the right lower lobe, unchanged since 2016 and non FDG avid on PET, likely benign.  Nonobstructive right renal calculi Chronic wall thickening involving sigmoid colon, likely reflecting chronic mucosal hypertrophy in the region of prior/chronic diverticulitis. Consider colonoscopy if not previously performed.  Report scanned. As per fax the patient already has follow up with Dr. Matthias Hughs, GI and a colonoscopy last year,   Chilton Greathouse MD [facility] Pager [phone] If no answer or after 3pm call: [phone] 09/12/2016, 1:43 PM

## 2016-09-13 ENCOUNTER — Other Ambulatory Visit: Payer: Self-pay | Admitting: Pulmonary Disease

## 2016-09-13 ENCOUNTER — Telehealth: Payer: Self-pay | Admitting: Pulmonary Disease

## 2016-09-13 DIAGNOSIS — N829 Female genital tract fistula, unspecified: Secondary | ICD-10-CM | POA: Diagnosis not present

## 2016-09-13 DIAGNOSIS — R911 Solitary pulmonary nodule: Secondary | ICD-10-CM

## 2016-09-13 NOTE — Progress Notes (Signed)
Called patient; unable to leave voicemail because it is full. Will call back at later time. CT chest without contrast in 1 year entered.

## 2016-09-13 NOTE — Telephone Encounter (Signed)
Notes Recorded by Chilton Greathouse, MD on 09/08/2016 at 2:59 AM EST Let the patient know that the CT shows stable lung nodules. They are likely secondary to her rheumatoid arthritis. We will follow with a repeat CT without contrast in 1 year. Please order scan.  Pt aware of results and voiced her understanding. Nothing further needed.

## 2016-09-22 DIAGNOSIS — R062 Wheezing: Secondary | ICD-10-CM | POA: Diagnosis not present

## 2016-09-22 DIAGNOSIS — R7989 Other specified abnormal findings of blood chemistry: Secondary | ICD-10-CM | POA: Diagnosis not present

## 2016-09-22 DIAGNOSIS — R918 Other nonspecific abnormal finding of lung field: Secondary | ICD-10-CM | POA: Diagnosis not present

## 2016-09-24 DIAGNOSIS — L570 Actinic keratosis: Secondary | ICD-10-CM | POA: Diagnosis not present

## 2016-10-01 DIAGNOSIS — N3021 Other chronic cystitis with hematuria: Secondary | ICD-10-CM | POA: Diagnosis not present

## 2016-10-01 DIAGNOSIS — N200 Calculus of kidney: Secondary | ICD-10-CM | POA: Diagnosis not present

## 2016-10-27 DIAGNOSIS — N824 Other female intestinal-genital tract fistulae: Secondary | ICD-10-CM | POA: Diagnosis not present

## 2016-10-27 DIAGNOSIS — R8290 Unspecified abnormal findings in urine: Secondary | ICD-10-CM | POA: Diagnosis not present

## 2016-10-27 DIAGNOSIS — N390 Urinary tract infection, site not specified: Secondary | ICD-10-CM | POA: Diagnosis not present

## 2016-10-28 ENCOUNTER — Encounter (HOSPITAL_BASED_OUTPATIENT_CLINIC_OR_DEPARTMENT_OTHER): Payer: 59

## 2016-11-22 ENCOUNTER — Other Ambulatory Visit: Payer: Self-pay | Admitting: Pulmonary Disease

## 2016-11-26 ENCOUNTER — Encounter: Payer: Self-pay | Admitting: Pulmonary Disease

## 2016-11-26 ENCOUNTER — Ambulatory Visit (INDEPENDENT_AMBULATORY_CARE_PROVIDER_SITE_OTHER): Payer: 59 | Admitting: Pulmonary Disease

## 2016-11-26 VITALS — BP 116/78 | HR 83 | Ht 64.0 in | Wt 185.0 lb

## 2016-11-26 DIAGNOSIS — J45901 Unspecified asthma with (acute) exacerbation: Secondary | ICD-10-CM

## 2016-11-26 DIAGNOSIS — J301 Allergic rhinitis due to pollen: Secondary | ICD-10-CM

## 2016-11-26 DIAGNOSIS — R05 Cough: Secondary | ICD-10-CM

## 2016-11-26 DIAGNOSIS — R059 Cough, unspecified: Secondary | ICD-10-CM

## 2016-11-26 DIAGNOSIS — N824 Other female intestinal-genital tract fistulae: Secondary | ICD-10-CM | POA: Diagnosis not present

## 2016-11-26 DIAGNOSIS — R058 Other specified cough: Secondary | ICD-10-CM

## 2016-11-26 DIAGNOSIS — Z79899 Other long term (current) drug therapy: Secondary | ICD-10-CM

## 2016-11-26 LAB — NITRIC OXIDE: Nitric Oxide: 55

## 2016-11-26 MED ORDER — FLUTICASONE PROPIONATE 50 MCG/ACT NA SUSP: 1.0000 | Freq: Every day | NASAL | Status: DC

## 2016-11-26 NOTE — Patient Instructions (Addendum)
Nasal irrigation (saline nasal spray) daily Fluticasone (flonase) 1 spray each nostril daily Restart arnuity Ventolin two puffs every four to six hours as needed for cough, wheeze, or chest congestion  Call if symptoms not improving after restarting arnuity  Follow up in 4 weeks with Dr. Isaiah Serge or Nurse Practitioner

## 2016-11-26 NOTE — Progress Notes (Signed)
Current Outpatient Prescriptions on File Prior to Visit  Medication Sig  . famotidine (PEPCID) 20 MG tablet Take 20 mg by mouth daily.  . fexofenadine (ALLEGRA) 180 MG tablet Take 180 mg by mouth daily.  Marland Kitchen lisinopril-hydrochlorothiazide (PRINZIDE,ZESTORETIC) 20-12.5 MG tablet Take 1 tablet by mouth daily.  . metoprolol succinate (TOPROL-XL) 50 MG 24 hr tablet Take 50 mg by mouth daily with breakfast.  . Multiple Vitamin (MULTIVITAMIN) capsule Take 1 capsule by mouth daily.  . VENTOLIN HFA 108 (90 Base) MCG/ACT inhaler inhale 2 puffs INTO THE LUNGS every 4 hours if needed  . Fluticasone Furoate (ARNUITY ELLIPTA) 100 MCG/ACT AEPB Inhale 1 puff into the lungs daily. (Patient not taking: Reported on 11/26/2016)   No current facility-administered medications on file prior to visit.      Chief Complaint  Patient presents with  . Acute Visit    PM patient: C/o wheezing x about 2 weeks with cough. Pt has not used Arnuity for about 3 weeks and has noticed her breathing worsen. Pt states that she woke up last night with chest tightness. Some throat clearing and raspy feeling in back of throat.     has a past medical history of Diverticulitis; Hemorrhoid; Hypercholesteremia; Hypertension; Lichenoid dermatitis; Obesity; and Seasonal allergies.   has a past surgical history that includes Abdominal hysterectomy (2015); Cholecystectomy (2005); Colonoscopy (2016); and Nasal sinus surgery (1991).  family history includes Aortic aneurysm in her mother; Colon polyps in her father and sister; Heart disease in her mother; Hypertension in her mother; Prostate cancer in her father.   reports that she has never smoked. She has never used smokeless tobacco. She reports that she drinks alcohol. She reports that she does not use drugs.   Allergies  Allergen Reactions  . Peanut-Containing Drug Products Anaphylaxis, Itching and Swelling    *Walnuts and Peacan's-not peanuts* Swelling of throat   . Ciprofloxacin      rash  . Losartan Rash  . Norvasc [Amlodipine Besylate] Rash  . Sulfa Antibiotics Rash    Vital Signs BP 116/78 (BP Location: Left Arm, Cuff Size: Normal)   Pulse 83   Ht 5\' 4"  (1.626 m)   Wt 185 lb (83.9 kg)   SpO2 98%   BMI 31.76 kg/m   History of Present Illness LORELI DEBRULER is a 56 y.o. female with cough.  She is followed by Dr 53 for lung nodules and asthma.  She is here for an acute visit.  She ran out of arnuity 3 weeks ago.  She was started on lisinopril by cardiology 2 months ago.  She gets seasonal allergies, and has noticed more allergies.  She is getting sinus congestion and post nasal drip.  She gets throat irritation and has to clear her throat.  She gets wheeze in her chest and upper neck airway.  She is not bringing up sputum.  She has been using albuterol and this helps some.  She denies fever, headache, ear pain, difficulty swallowing, nausea, reflux, skin rash, or leg swelling.  She denies sick exposures.  She doesn't smoke.   Physical Exam  General - No distress Eyes - pupils reactive ENT - No sinus tenderness, clear nasal drainage, no oral exudate, no LAN Cardiac - s1s2 regular, no murmur Chest - No wheeze/rales/dullness Back - No focal tenderness Abd - Soft, non-tender Ext - No edema Neuro - Normal strength Skin - No rashes Psych - normal mood, and behavior  FeNO 11/26/16 >> 55 Spirometry 11/26/16 >> FEV1 2.31 (  86%), FEV1% 78  Discussion: She has cough, sinus congestion with post nasal drip, and wheezing.  She ran out of ICS recently.  She has seasonal allergies.  She likely has allergic rhinitis with post nasal drip, upper airway cough, and allergic asthma.  FeNO elevated, and spirometry normal.  Of note is that she was also recently started on lisinopril.  Assessment/Plan  Upper airway cough. - nasal irrigation, flonase  Allergic asthma. - restart arnuity - continue prn ventolin  ACE inhibitor use. - if symptoms not improved,  then will need to d/w cardiology about changing lisinopril   Patient Instructions  Nasal irrigation (saline nasal spray) daily Fluticasone (flonase) 1 spray each nostril daily Restart arnuity Ventolin two puffs every four to six hours as needed for cough, wheeze, or chest congestion  Call if symptoms not improving after restarting arnuity  Follow up in 4 weeks with Dr. Isaiah Serge or Nurse Practitioner    Coralyn Helling, MD [place] [facility] Pager:  [phone] 11/26/2016, 12:00 PM

## 2016-12-30 ENCOUNTER — Ambulatory Visit: Payer: 59 | Admitting: Pulmonary Disease

## 2017-04-15 DIAGNOSIS — I10 Essential (primary) hypertension: Secondary | ICD-10-CM | POA: Diagnosis not present

## 2017-04-15 DIAGNOSIS — R1032 Left lower quadrant pain: Secondary | ICD-10-CM | POA: Diagnosis not present

## 2017-04-29 DIAGNOSIS — I10 Essential (primary) hypertension: Secondary | ICD-10-CM | POA: Diagnosis not present

## 2017-05-20 DIAGNOSIS — Z23 Encounter for immunization: Secondary | ICD-10-CM | POA: Diagnosis not present

## 2017-06-15 ENCOUNTER — Telehealth: Payer: Self-pay | Admitting: Pulmonary Disease

## 2017-06-15 MED ORDER — FLUTICASONE FUROATE 100 MCG/ACT IN AEPB: 1.0000 | INHALATION_SPRAY | Freq: Every day | RESPIRATORY_TRACT | 5 refills | Status: DC

## 2017-06-15 NOTE — Telephone Encounter (Signed)
Spoke with pt and advised rx sent to pharmacy  

## 2017-06-16 ENCOUNTER — Telehealth: Payer: Self-pay | Admitting: Pulmonary Disease

## 2017-06-16 NOTE — Telephone Encounter (Signed)
Pt aware that coupon for Arnuity has been placed up front for pick up. Nothing further needed.

## 2017-07-03 IMAGING — CT CT CHEST W/O CM
2 of 3 series · 15 of 36 positions shown, 18 images · non-contrast
Comparison: Chest CT 09/12/2015 and PET-CT 10/23/2015

CLINICAL DATA: Followup pulmonary nodules.

EXAM:
CT CHEST WITHOUT CONTRAST
TECHNIQUE: Multidetector CT imaging of the chest was performed following the
standard protocol without IV contrast.

[Series 2: thorax · axial · 0.72mm/px · z∈[-275,-23]mm · 12 of 150 slices shown, 15 images]
[im 12/150  mediastinal]
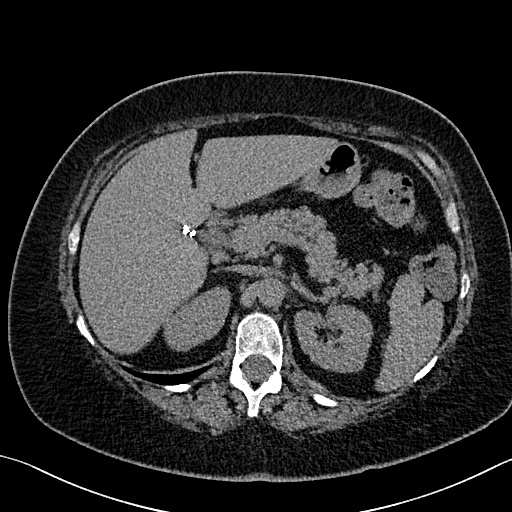
[im 12/150  lung]
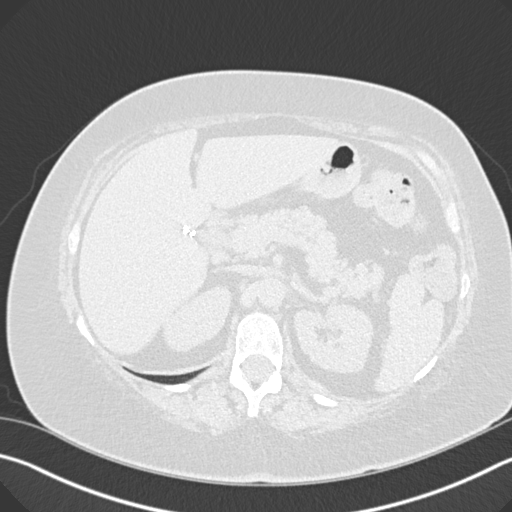
[im 23/150  lung]
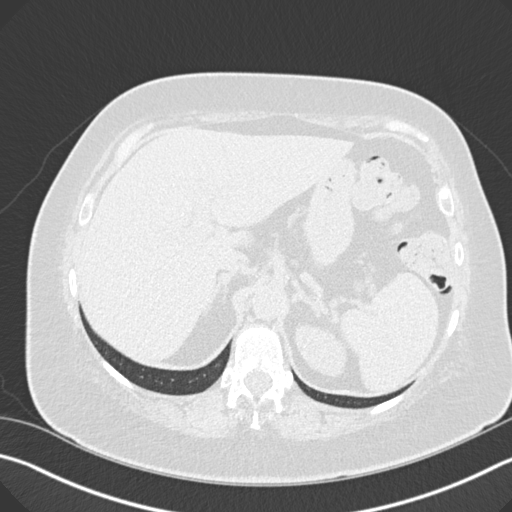
[im 34/150  lung]
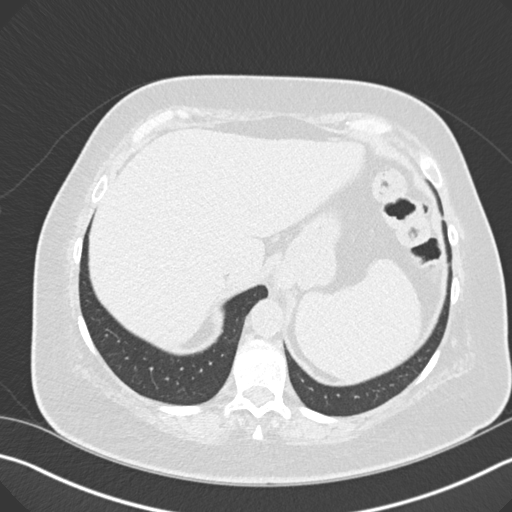
[im 45/150  lung]
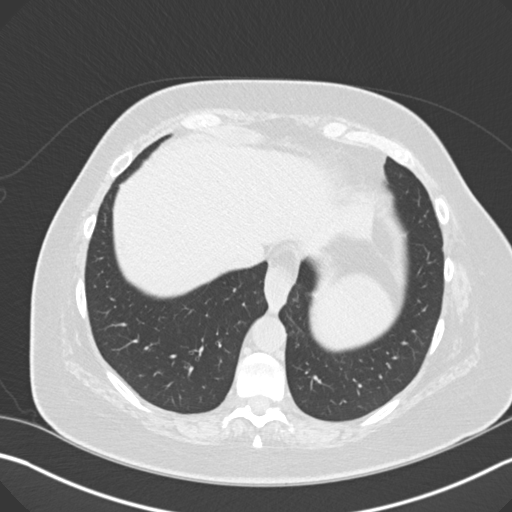
[im 56/150  mediastinal]
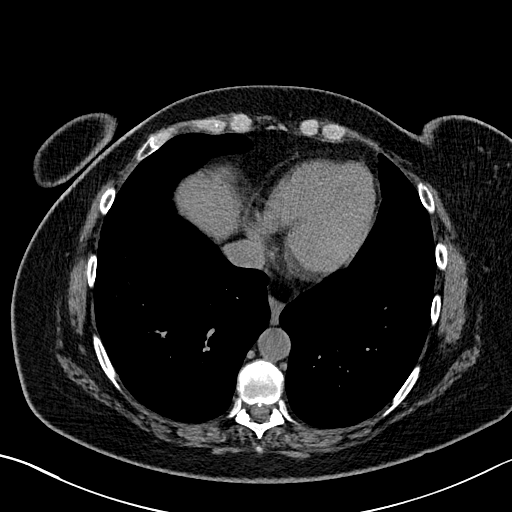
[im 56/150  lung]
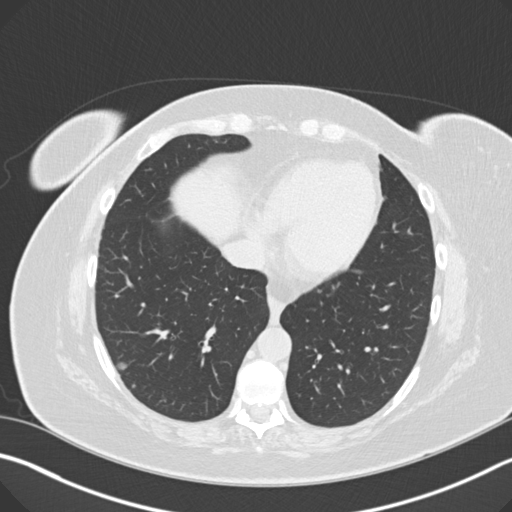
[im 67/150  lung]
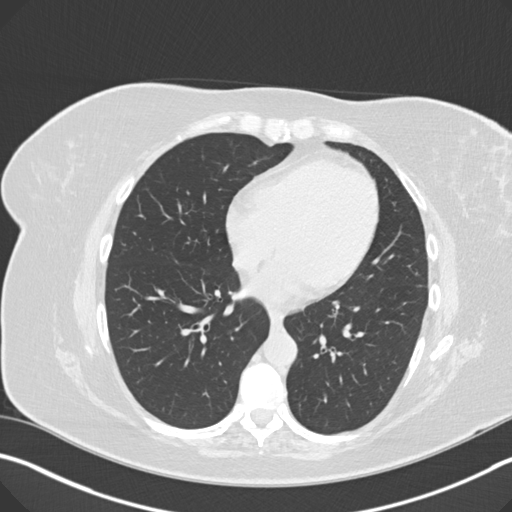
[im 83/150  lung]
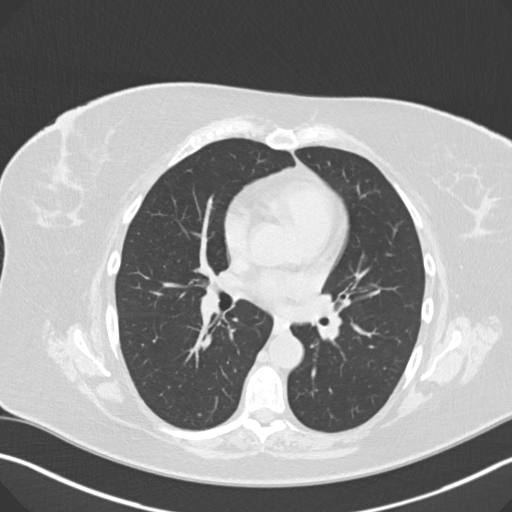
[im 94/150  lung]
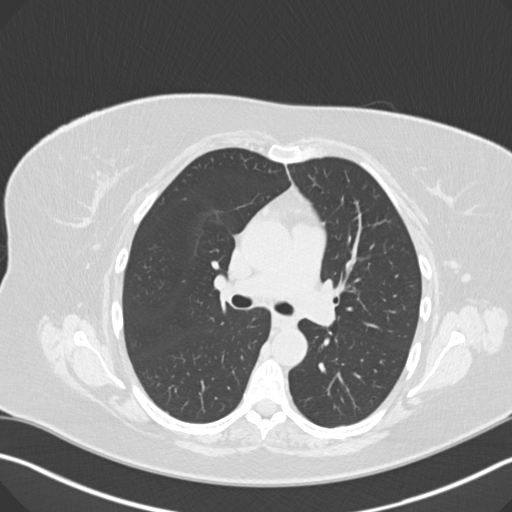
[im 105/150  mediastinal]
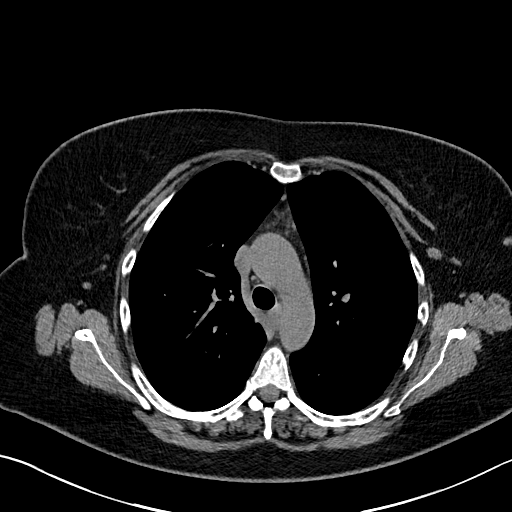
[im 105/150  lung]
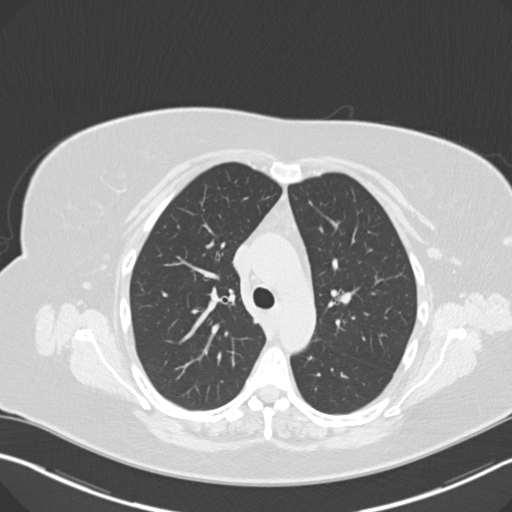
[im 116/150  lung]
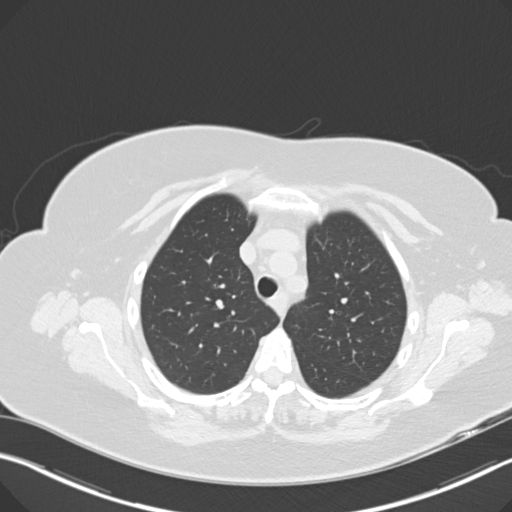
[im 127/150  lung]
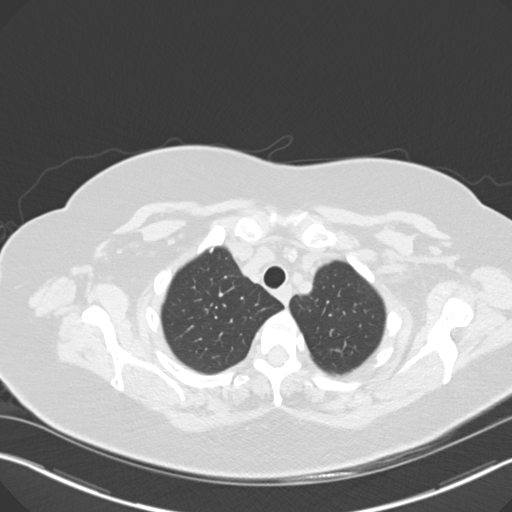
[im 138/150  lung]
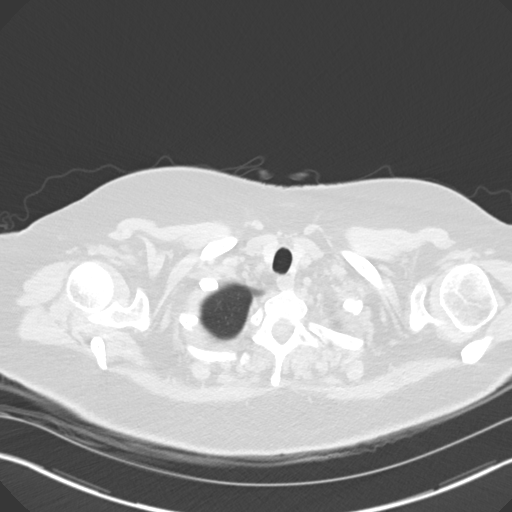

[Series 5: coronal · coronal · 0.59mm/px · 3 of 133 slices shown]
[im 27/133  lung]
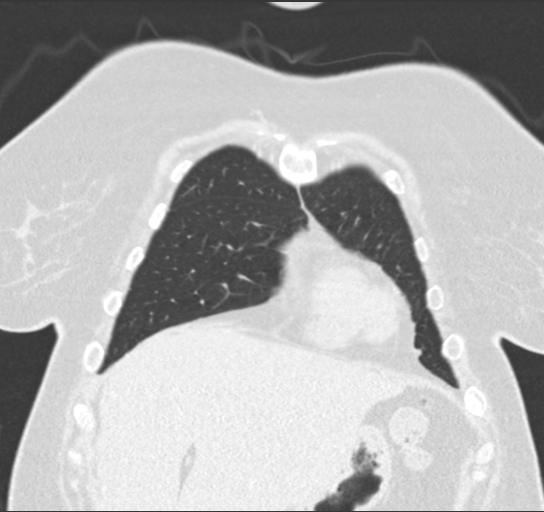
[im 53/133  lung]
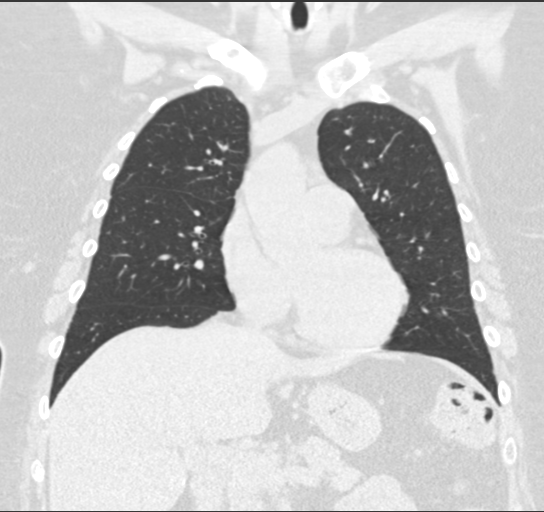
[im 80/133  lung]
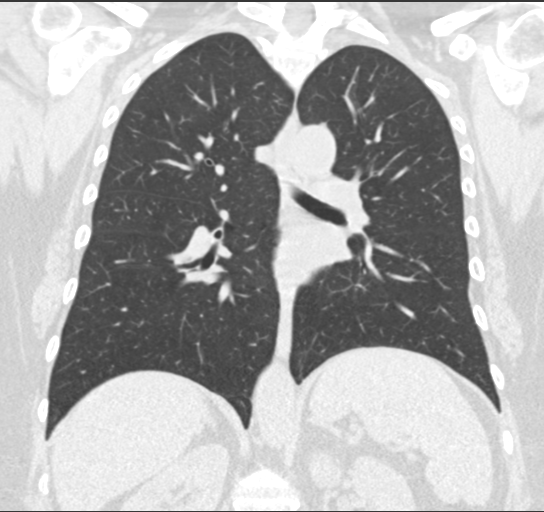

[15 of 36 positions shown; findings below may reference images not displayed]

FINDINGS: Chest wall: No breast masses, supraclavicular or axillary
lymphadenopathy. Small scattered nodes are stable. The thyroid gland
appears normal.

Cardiovascular: The heart is normal in size. No pericardial
effusion. The aorta is normal in caliber. No significant
atherosclerotic calcifications. A few small scattered coronary
artery calcifications.

Mediastinum/Nodes: No mediastinal or hilar mass or lymphadenopathy.
The esophagus is grossly normal.

Lungs/Pleura: Numerous small scattered pulmonary nodules are noted
in both lungs. The largest nodule in the right lower lobe on image
number 98 measures 13 x 11 mm. This is stable. It is smoothly
marginated and well circumscribed. No new lesions. No acute
pulmonary findings.

Upper Abdomen: No significant upper abdominal findings. The
gallbladder is surgically absent. No adenopathy. The adrenal glands
are normal.

Musculoskeletal: No significant bony findings.
IMPRESSION: 1. Stable bilateral small pulmonary nodules. The largest lesion in
the right lower lobe is unchanged. Recommend 12 month followup
noncontrast chest CT to document 2 years of stability.
2. No mediastinal or hilar mass or adenopathy.
3. No acute pulmonary findings.

## 2017-07-20 DIAGNOSIS — J029 Acute pharyngitis, unspecified: Secondary | ICD-10-CM | POA: Diagnosis not present

## 2017-08-09 DIAGNOSIS — J029 Acute pharyngitis, unspecified: Secondary | ICD-10-CM | POA: Diagnosis not present

## 2017-08-31 DIAGNOSIS — Z1231 Encounter for screening mammogram for malignant neoplasm of breast: Secondary | ICD-10-CM | POA: Diagnosis not present

## 2017-08-31 DIAGNOSIS — N390 Urinary tract infection, site not specified: Secondary | ICD-10-CM | POA: Diagnosis not present

## 2017-08-31 DIAGNOSIS — Z01419 Encounter for gynecological examination (general) (routine) without abnormal findings: Secondary | ICD-10-CM | POA: Diagnosis not present

## 2017-08-31 DIAGNOSIS — D649 Anemia, unspecified: Secondary | ICD-10-CM | POA: Diagnosis not present

## 2017-09-06 ENCOUNTER — Ambulatory Visit (INDEPENDENT_AMBULATORY_CARE_PROVIDER_SITE_OTHER)
Admission: RE | Admit: 2017-09-06 | Discharge: 2017-09-06 | Disposition: A | Discharge status: Home / Self Care | Payer: 59 | Source: Ambulatory Visit | Attending: Pulmonary Disease | Admitting: Pulmonary Disease

## 2017-09-06 DIAGNOSIS — R911 Solitary pulmonary nodule: Secondary | ICD-10-CM | POA: Diagnosis not present

## 2017-09-06 DIAGNOSIS — R918 Other nonspecific abnormal finding of lung field: Secondary | ICD-10-CM | POA: Diagnosis not present

## 2017-09-19 NOTE — Progress Notes (Signed)
LMTCB on preferred phone number listed for patient. 

## 2017-09-21 ENCOUNTER — Telehealth: Payer: Self-pay | Admitting: Pulmonary Disease

## 2017-09-21 DIAGNOSIS — R918 Other nonspecific abnormal finding of lung field: Secondary | ICD-10-CM

## 2017-09-21 DIAGNOSIS — R911 Solitary pulmonary nodule: Secondary | ICD-10-CM

## 2017-09-21 NOTE — Telephone Encounter (Signed)
Called and spoke with pt letting her know the results of the ct scan and that Dr.Mannam wanted her to have a repeated scan 1 year from now to compare it with one she just had done.  Pt expressed understanding. Order placed for ct scan in 1 year. Nothing further needed at this current time.

## 2017-09-22 DIAGNOSIS — L439 Lichen planus, unspecified: Secondary | ICD-10-CM | POA: Diagnosis not present

## 2017-09-22 DIAGNOSIS — R7989 Other specified abnormal findings of blood chemistry: Secondary | ICD-10-CM | POA: Diagnosis not present

## 2017-09-22 DIAGNOSIS — R918 Other nonspecific abnormal finding of lung field: Secondary | ICD-10-CM | POA: Diagnosis not present

## 2017-09-23 DIAGNOSIS — H903 Sensorineural hearing loss, bilateral: Secondary | ICD-10-CM | POA: Diagnosis not present

## 2017-10-17 DIAGNOSIS — R3915 Urgency of urination: Secondary | ICD-10-CM | POA: Diagnosis not present

## 2017-11-04 DIAGNOSIS — E7800 Pure hypercholesterolemia, unspecified: Secondary | ICD-10-CM | POA: Diagnosis not present

## 2017-11-04 DIAGNOSIS — I10 Essential (primary) hypertension: Secondary | ICD-10-CM | POA: Diagnosis not present

## 2017-11-04 DIAGNOSIS — D649 Anemia, unspecified: Secondary | ICD-10-CM | POA: Diagnosis not present

## 2018-01-16 DIAGNOSIS — R8290 Unspecified abnormal findings in urine: Secondary | ICD-10-CM | POA: Diagnosis not present

## 2018-01-16 DIAGNOSIS — N390 Urinary tract infection, site not specified: Secondary | ICD-10-CM | POA: Diagnosis not present

## 2018-02-09 DIAGNOSIS — R350 Frequency of micturition: Secondary | ICD-10-CM | POA: Diagnosis not present

## 2018-05-19 DIAGNOSIS — E7800 Pure hypercholesterolemia, unspecified: Secondary | ICD-10-CM | POA: Diagnosis not present

## 2018-05-19 DIAGNOSIS — Z23 Encounter for immunization: Secondary | ICD-10-CM | POA: Diagnosis not present

## 2018-05-19 DIAGNOSIS — Z0000 Encounter for general adult medical examination without abnormal findings: Secondary | ICD-10-CM | POA: Diagnosis not present

## 2018-05-19 DIAGNOSIS — I10 Essential (primary) hypertension: Secondary | ICD-10-CM | POA: Diagnosis not present

## 2018-06-09 ENCOUNTER — Other Ambulatory Visit: Payer: Self-pay | Admitting: Pulmonary Disease

## 2018-06-20 DIAGNOSIS — N823 Fistula of vagina to large intestine: Secondary | ICD-10-CM | POA: Diagnosis not present

## 2018-06-20 DIAGNOSIS — D508 Other iron deficiency anemias: Secondary | ICD-10-CM | POA: Diagnosis not present

## 2018-06-20 DIAGNOSIS — R1310 Dysphagia, unspecified: Secondary | ICD-10-CM | POA: Diagnosis not present

## 2018-06-20 DIAGNOSIS — Z8719 Personal history of other diseases of the digestive system: Secondary | ICD-10-CM | POA: Diagnosis not present

## 2018-06-20 DIAGNOSIS — D649 Anemia, unspecified: Secondary | ICD-10-CM | POA: Diagnosis not present

## 2018-06-20 DIAGNOSIS — K449 Diaphragmatic hernia without obstruction or gangrene: Secondary | ICD-10-CM | POA: Diagnosis not present

## 2018-06-20 DIAGNOSIS — K219 Gastro-esophageal reflux disease without esophagitis: Secondary | ICD-10-CM | POA: Diagnosis not present

## 2018-06-20 DIAGNOSIS — N824 Other female intestinal-genital tract fistulae: Secondary | ICD-10-CM | POA: Diagnosis not present

## 2018-06-20 DIAGNOSIS — I10 Essential (primary) hypertension: Secondary | ICD-10-CM | POA: Diagnosis not present

## 2018-06-21 DIAGNOSIS — K6389 Other specified diseases of intestine: Secondary | ICD-10-CM | POA: Diagnosis not present

## 2018-06-21 DIAGNOSIS — I10 Essential (primary) hypertension: Secondary | ICD-10-CM | POA: Diagnosis not present

## 2018-06-21 DIAGNOSIS — Z1211 Encounter for screening for malignant neoplasm of colon: Secondary | ICD-10-CM | POA: Diagnosis not present

## 2018-06-21 DIAGNOSIS — K562 Volvulus: Secondary | ICD-10-CM | POA: Diagnosis not present

## 2018-06-21 DIAGNOSIS — N823 Fistula of vagina to large intestine: Secondary | ICD-10-CM | POA: Diagnosis not present

## 2018-06-21 DIAGNOSIS — K5730 Diverticulosis of large intestine without perforation or abscess without bleeding: Secondary | ICD-10-CM | POA: Diagnosis not present

## 2018-06-21 DIAGNOSIS — K641 Second degree hemorrhoids: Secondary | ICD-10-CM | POA: Diagnosis not present

## 2018-06-21 DIAGNOSIS — N828 Other female genital tract fistulae: Secondary | ICD-10-CM | POA: Diagnosis not present

## 2018-07-07 DIAGNOSIS — N824 Other female intestinal-genital tract fistulae: Secondary | ICD-10-CM | POA: Diagnosis not present

## 2018-07-28 DIAGNOSIS — R399 Unspecified symptoms and signs involving the genitourinary system: Secondary | ICD-10-CM | POA: Diagnosis not present

## 2018-07-28 DIAGNOSIS — R309 Painful micturition, unspecified: Secondary | ICD-10-CM | POA: Diagnosis not present

## 2018-08-04 DIAGNOSIS — K5730 Diverticulosis of large intestine without perforation or abscess without bleeding: Secondary | ICD-10-CM | POA: Diagnosis not present

## 2018-08-22 DIAGNOSIS — R05 Cough: Secondary | ICD-10-CM | POA: Diagnosis not present

## 2018-09-01 DIAGNOSIS — D649 Anemia, unspecified: Secondary | ICD-10-CM | POA: Diagnosis not present

## 2018-09-01 DIAGNOSIS — Z13820 Encounter for screening for osteoporosis: Secondary | ICD-10-CM | POA: Diagnosis not present

## 2018-09-01 DIAGNOSIS — Z6835 Body mass index (BMI) 35.0-35.9, adult: Secondary | ICD-10-CM | POA: Diagnosis not present

## 2018-09-01 DIAGNOSIS — Z1231 Encounter for screening mammogram for malignant neoplasm of breast: Secondary | ICD-10-CM | POA: Diagnosis not present

## 2018-09-01 DIAGNOSIS — Z01419 Encounter for gynecological examination (general) (routine) without abnormal findings: Secondary | ICD-10-CM | POA: Diagnosis not present

## 2018-09-06 ENCOUNTER — Inpatient Hospital Stay: Admission: RE | Admit: 2018-09-06 | Payer: 59 | Source: Ambulatory Visit

## 2018-09-15 ENCOUNTER — Encounter (INDEPENDENT_AMBULATORY_CARE_PROVIDER_SITE_OTHER): Payer: Self-pay

## 2018-09-15 ENCOUNTER — Other Ambulatory Visit: Payer: 59

## 2018-09-15 ENCOUNTER — Ambulatory Visit (INDEPENDENT_AMBULATORY_CARE_PROVIDER_SITE_OTHER)
Admission: RE | Admit: 2018-09-15 | Discharge: 2018-09-15 | Disposition: A | Discharge status: Home / Self Care | Payer: BLUE CROSS/BLUE SHIELD | Source: Ambulatory Visit | Attending: Pulmonary Disease | Admitting: Pulmonary Disease

## 2018-09-15 DIAGNOSIS — R918 Other nonspecific abnormal finding of lung field: Secondary | ICD-10-CM | POA: Diagnosis not present

## 2018-09-19 ENCOUNTER — Other Ambulatory Visit: Payer: Self-pay | Admitting: Pulmonary Disease

## 2018-09-19 DIAGNOSIS — R911 Solitary pulmonary nodule: Secondary | ICD-10-CM

## 2018-09-22 DIAGNOSIS — R05 Cough: Secondary | ICD-10-CM | POA: Diagnosis not present

## 2018-09-22 DIAGNOSIS — R918 Other nonspecific abnormal finding of lung field: Secondary | ICD-10-CM | POA: Diagnosis not present

## 2018-09-22 DIAGNOSIS — M7989 Other specified soft tissue disorders: Secondary | ICD-10-CM | POA: Diagnosis not present

## 2018-09-22 DIAGNOSIS — L439 Lichen planus, unspecified: Secondary | ICD-10-CM | POA: Diagnosis not present

## 2018-09-22 DIAGNOSIS — R7989 Other specified abnormal findings of blood chemistry: Secondary | ICD-10-CM | POA: Diagnosis not present

## 2018-10-02 DIAGNOSIS — R399 Unspecified symptoms and signs involving the genitourinary system: Secondary | ICD-10-CM | POA: Diagnosis not present

## 2018-10-07 ENCOUNTER — Encounter (HOSPITAL_BASED_OUTPATIENT_CLINIC_OR_DEPARTMENT_OTHER): Payer: Self-pay | Admitting: *Deleted

## 2018-10-07 ENCOUNTER — Other Ambulatory Visit: Payer: Self-pay

## 2018-10-07 ENCOUNTER — Emergency Department (HOSPITAL_BASED_OUTPATIENT_CLINIC_OR_DEPARTMENT_OTHER)
Admission: EM | Admit: 2018-10-07 | Discharge: 2018-10-07 | Disposition: A | Discharge status: Home / Self Care | Payer: BLUE CROSS/BLUE SHIELD | Attending: Emergency Medicine | Admitting: Emergency Medicine

## 2018-10-07 DIAGNOSIS — K5792 Diverticulitis of intestine, part unspecified, without perforation or abscess without bleeding: Secondary | ICD-10-CM | POA: Diagnosis not present

## 2018-10-07 DIAGNOSIS — Z79899 Other long term (current) drug therapy: Secondary | ICD-10-CM | POA: Diagnosis not present

## 2018-10-07 DIAGNOSIS — R1032 Left lower quadrant pain: Secondary | ICD-10-CM | POA: Diagnosis not present

## 2018-10-07 DIAGNOSIS — R109 Unspecified abdominal pain: Secondary | ICD-10-CM | POA: Diagnosis not present

## 2018-10-07 DIAGNOSIS — K5732 Diverticulitis of large intestine without perforation or abscess without bleeding: Secondary | ICD-10-CM | POA: Insufficient documentation

## 2018-10-07 DIAGNOSIS — Z91010 Allergy to peanuts: Secondary | ICD-10-CM | POA: Diagnosis not present

## 2018-10-07 DIAGNOSIS — I10 Essential (primary) hypertension: Secondary | ICD-10-CM | POA: Insufficient documentation

## 2018-10-07 HISTORY — DX: Other specified disorders of the skin and subcutaneous tissue: L98.8

## 2018-10-07 LAB — URINALYSIS, ROUTINE W REFLEX MICROSCOPIC
Bilirubin Urine: NEGATIVE
Glucose, UA: NEGATIVE mg/dL
Hgb urine dipstick: NEGATIVE
Ketones, ur: NEGATIVE mg/dL
Nitrite: NEGATIVE
Protein, ur: NEGATIVE mg/dL
Specific Gravity, Urine: 1.02 (ref 1.005–1.030)
pH: 5.5 (ref 5.0–8.0)

## 2018-10-07 LAB — CBC WITH DIFFERENTIAL/PLATELET
Abs Immature Granulocytes: 0.05 10*3/uL (ref 0.00–0.07)
Basophils Absolute: 0 10*3/uL (ref 0.0–0.1)
Basophils Relative: 0 %
Eosinophils Absolute: 0.3 10*3/uL (ref 0.0–0.5)
Eosinophils Relative: 2 %
HCT: 36.6 % (ref 36.0–46.0)
Hemoglobin: 11.8 g/dL — ABNORMAL LOW (ref 12.0–15.0)
Immature Granulocytes: 0 %
Lymphocytes Relative: 9 %
Lymphs Abs: 1.3 10*3/uL (ref 0.7–4.0)
MCH: 29.3 pg (ref 26.0–34.0)
MCHC: 32.2 g/dL (ref 30.0–36.0)
MCV: 90.8 fL (ref 80.0–100.0)
Monocytes Absolute: 1.7 10*3/uL — ABNORMAL HIGH (ref 0.1–1.0)
Monocytes Relative: 12 %
Neutro Abs: 11.3 10*3/uL — ABNORMAL HIGH (ref 1.7–7.7)
Neutrophils Relative %: 77 %
Platelets: 240 10*3/uL (ref 150–400)
RBC: 4.03 MIL/uL (ref 3.87–5.11)
RDW: 14.7 % (ref 11.5–15.5)
WBC: 14.6 10*3/uL — ABNORMAL HIGH (ref 4.0–10.5)
nRBC: 0 % (ref 0.0–0.2)

## 2018-10-07 LAB — LIPASE, BLOOD: Lipase: 20 U/L (ref 11–51)

## 2018-10-07 LAB — COMPREHENSIVE METABOLIC PANEL
ALT: 17 U/L (ref 0–44)
AST: 16 U/L (ref 15–41)
Albumin: 3.8 g/dL (ref 3.5–5.0)
Alkaline Phosphatase: 90 U/L (ref 38–126)
Anion gap: 8 (ref 5–15)
BUN: 20 mg/dL (ref 6–20)
CO2: 24 mmol/L (ref 22–32)
Calcium: 8.7 mg/dL — ABNORMAL LOW (ref 8.9–10.3)
Chloride: 100 mmol/L (ref 98–111)
Creatinine, Ser: 0.85 mg/dL (ref 0.44–1.00)
GFR calc Af Amer: >60 mL/min (ref >60)
GFR calc non Af Amer: >60 mL/min (ref >60)
Glucose, Bld: 106 mg/dL — ABNORMAL HIGH (ref 70–99)
Potassium: 3.4 mmol/L — ABNORMAL LOW (ref 3.5–5.1)
Sodium: 132 mmol/L — ABNORMAL LOW (ref 135–145)
Total Bilirubin: 1.8 mg/dL — ABNORMAL HIGH (ref 0.3–1.2)
Total Protein: 7.3 g/dL (ref 6.5–8.1)

## 2018-10-07 LAB — URINALYSIS, MICROSCOPIC (REFLEX): RBC / HPF: NONE SEEN RBC/hpf (ref 0–5)

## 2018-10-07 MED ORDER — AMOXICILLIN 500 MG PO CAPS
500.0000 mg | ORAL_CAPSULE | Freq: Once | ORAL | Status: AC
  Administered 2018-10-07: 500 mg via ORAL
  Filled 2018-10-07: qty 1

## 2018-10-07 MED ORDER — METRONIDAZOLE 500 MG PO TABS: 500.0000 mg | ORAL_TABLET | Freq: Two times a day (BID) | ORAL | 0 refills | Status: DC

## 2018-10-07 MED ORDER — ONDANSETRON HCL 4 MG/2ML IJ SOLN
4.0000 mg | Freq: Once | INTRAMUSCULAR | Status: DC
  Filled 2018-10-07: qty 2

## 2018-10-07 MED ORDER — AMOXICILLIN 500 MG PO CAPS: 500.0000 mg | ORAL_CAPSULE | Freq: Two times a day (BID) | ORAL | 0 refills | Status: AC

## 2018-10-07 MED ORDER — METRONIDAZOLE 500 MG PO TABS
500.0000 mg | ORAL_TABLET | Freq: Once | ORAL | Status: AC
  Administered 2018-10-07: 500 mg via ORAL
  Filled 2018-10-07: qty 1

## 2018-10-07 MED ORDER — POTASSIUM CHLORIDE CRYS ER 20 MEQ PO TBCR
20.0000 meq | EXTENDED_RELEASE_TABLET | Freq: Once | ORAL | Status: AC
  Administered 2018-10-07: 20 meq via ORAL
  Filled 2018-10-07: qty 1

## 2018-10-07 NOTE — ED Provider Notes (Signed)
[facility] EMERGENCY DEPARTMENT Provider Note   CSN: [id number] Arrival date & time: 10/07/18  1550    History   Chief Complaint Chief Complaint  Patient presents with  . Abdominal Pain    HPI Natasha Wilkinson is a 58 y.o. female past 1 history of diverticulitis, hemorrhoid, hypertension who presents for evaluation of left lower quadrant abdominal pain that began yesterday.  Patient reports that pain is constant in nature but will occasionally worsen.  She states she has associated nausea but no vomiting.  She has been able to tolerate p.o. without any difficulty.  Patient states she has had 7-8 episodes of nonbloody diarrhea since onset of symptoms.  She reports history of diverticulitis and states symptoms feels similar.  She has not measured any fever.  Patient recently diagnosed with UTI 2 days ago and was started on Macrobid which she has been taking.  Patient denies any fevers, chest pain, difficulty breathing, dysuria, hematuria.      The history is provided by the patient.    Past Medical History:  Diagnosis Date  . Diverticulitis   . Fistula   . Hemorrhoid   . Hypercholesteremia   . Hypertension   . Lichenoid dermatitis   . Obesity   . Seasonal allergies     Patient Active Problem List   Diagnosis Date Noted  . Seasonal allergies   . Hypertension   . Hypercholesteremia   . Obesity   . Lichenoid dermatitis   . Diverticulitis   . Hemorrhoid     Past Surgical History:  Procedure Laterality Date  . ABDOMINAL HYSTERECTOMY  2015  . CHOLECYSTECTOMY  2005  . COLONOSCOPY  2016   incomplete  . NASAL SINUS SURGERY  1991     OB History   No obstetric history on file.      Home Medications    Prior to Admission medications   Medication Sig Start Date End Date Taking? Authorizing Provider  amoxicillin (AMOXIL) 500 MG capsule Take 1 capsule (500 mg total) by mouth 2 (two) times daily for 7 days. 10/07/18 10/14/18  Maxwell CaulLayden, Tenesha Garza A, PA-C    famotidine (PEPCID) 20 MG tablet Take 20 mg by mouth daily.    [provider]  fexofenadine (ALLEGRA) 180 MG tablet Take 180 mg by mouth daily.    [provider]  fluticasone (FLONASE) 50 MCG/ACT nasal spray Place 1 spray into both nostrils daily. 11/26/16   Coralyn HellingSood, Vineet, MD  Fluticasone Furoate (ARNUITY ELLIPTA) 100 MCG/ACT AEPB Inhale 1 puff daily into the lungs. 06/15/17   Mannam, Colbert CoyerPraveen, MD  lisinopril-hydrochlorothiazide (PRINZIDE,ZESTORETIC) 20-12.5 MG tablet Take 1 tablet by mouth daily. 07/08/16   [provider]  metoprolol succinate (TOPROL-XL) 50 MG 24 hr tablet Take 50 mg by mouth daily with breakfast. 02/14/15   [provider]  metroNIDAZOLE (FLAGYL) 500 MG tablet Take 1 tablet (500 mg total) by mouth 2 (two) times daily. 10/07/18   Maxwell CaulLayden, Hanne Kegg A, PA-C  Multiple Vitamin (MULTIVITAMIN) capsule Take 1 capsule by mouth daily.    [provider]  VENTOLIN HFA 108 (90 Base) MCG/ACT inhaler inhale 2 puffs INTO THE LUNGS every 4 hours if needed 11/22/16   Chilton GreathouseMannam, Praveen, MD    Family History Family History  Problem Relation Age of Onset  . Colon polyps Father   . Prostate cancer Father   . Aortic aneurysm Mother   . Hypertension Mother   . Heart disease Mother   . Colon polyps Sister  Social History Social History   Tobacco Use  . Smoking status: Never Smoker  . Smokeless tobacco: Never Used  Substance Use Topics  . Alcohol use: Yes    Alcohol/week: 0.0 standard drinks    Comment: rare  . Drug use: No     Allergies   Peanut-containing drug products; Ciprofloxacin; Hydrocodone-acetaminophen; Losartan; Norvasc [amlodipine besylate]; and Sulfa antibiotics   Review of Systems Review of Systems  Constitutional: Negative for fever.  Respiratory: Negative for cough and shortness of breath.   Cardiovascular: Negative for chest pain.  Gastrointestinal: Positive for abdominal pain, diarrhea and nausea. Negative for blood in  stool and vomiting.  Genitourinary: Negative for dysuria and hematuria.  Neurological: Negative for headaches.  All other systems reviewed and are negative.    Physical Exam Updated Vital Signs BP 115/70   Pulse 93   Temp 99 F (37.2 C) (Oral)   Resp 20   Ht 5\' 5"  (1.651 m)   Wt 95.3 kg   SpO2 93%   BMI 34.95 kg/m   Physical Exam Vitals signs and nursing note reviewed.  Constitutional:      Appearance: Normal appearance. She is well-developed.  HENT:     Head: Normocephalic and atraumatic.  Eyes:     General: Lids are normal.     Conjunctiva/sclera: Conjunctivae normal.     Pupils: Pupils are equal, round, and reactive to light.  Neck:     Musculoskeletal: Full passive range of motion without pain.  Cardiovascular:     Rate and Rhythm: Normal rate and regular rhythm.     Pulses: Normal pulses.     Heart sounds: Normal heart sounds. No murmur. No friction rub. No gallop.   Pulmonary:     Effort: Pulmonary effort is normal.     Breath sounds: Normal breath sounds.     Comments: Lungs clear to auscultation bilaterally.  Symmetric chest rise.  No wheezing, rales, rhonchi. Abdominal:     Palpations: Abdomen is soft. Abdomen is not rigid.     Tenderness: There is abdominal tenderness in the left lower quadrant. There is no right CVA tenderness, left CVA tenderness or guarding.     Comments: Abdomen is soft, nondistended.  Mild tenderness noted to left lower quadrant.  No CVA tenderness bilaterally.  No rigidity, guarding.  Musculoskeletal: Normal range of motion.  Skin:    General: Skin is warm and dry.     Capillary Refill: Capillary refill takes less than 2 seconds.  Neurological:     Mental Status: She is alert and oriented to person, place, and time.  Psychiatric:        Speech: Speech normal.      ED Treatments / Results  Labs (all labs ordered are listed, but only abnormal results are displayed) Labs Reviewed  COMPREHENSIVE METABOLIC PANEL - Abnormal;  Notable for the following components:      Result Value   Sodium 132 (*)    Potassium 3.4 (*)    Glucose, Bld 106 (*)    Calcium 8.7 (*)    Total Bilirubin 1.8 (*)    All other components within normal limits  CBC WITH DIFFERENTIAL/PLATELET - Abnormal; Notable for the following components:   WBC 14.6 (*)    Hemoglobin 11.8 (*)    Neutro Abs 11.3 (*)    Monocytes Absolute 1.7 (*)    All other components within normal limits  URINALYSIS, ROUTINE W REFLEX MICROSCOPIC - Abnormal; Notable for the following components:   Leukocytes,Ua  TRACE (*)    All other components within normal limits  URINALYSIS, MICROSCOPIC (REFLEX) - Abnormal; Notable for the following components:   Bacteria, UA RARE (*)    All other components within normal limits  LIPASE, BLOOD    EKG None  Radiology No results found.  Procedures Procedures (including critical care time)  Medications Ordered in ED Medications  ondansetron (ZOFRAN) injection 4 mg (4 mg Intravenous Refused 10/07/18 1719)  amoxicillin (AMOXIL) capsule 500 mg (500 mg Oral Given 10/07/18 1853)  metroNIDAZOLE (FLAGYL) tablet 500 mg (500 mg Oral Given 10/07/18 1853)  potassium chloride SA (K-DUR,KLOR-CON) CR tablet 20 mEq (20 mEq Oral Given 10/07/18 1853)     Initial Impression / Assessment and Plan / ED Course  I have reviewed the triage vital signs and the nursing notes.  Pertinent labs & imaging results that were available during my care of the patient were reviewed by me and considered in my medical decision making (see chart for details).        58 year old female who presents for evaluation of left lower quadrant abdominal pain that began yesterday.  Associated nausea, diarrhea.  No vomiting, fevers.  History of diverticulitis and states it feels the same.  She is currently being treated for UTI with Macrobid. Patient is afebrile, non-toxic appearing, sitting comfortably on examination table. Vital signs reviewed and stable.  Patient with  mild tenderness palpation in the left lower quadrant.  Consider infectious process versus diverticulitis.  We will plan to check labs, UA.  Offered patient analgesics but she declined.  CMP shows slight hypokalemia of 3.4.  Otherwise unremarkable.  Lipase unremarkable.  CBC shows leukocytosis of 14.6.  Hemoglobin is 11.8.  Review of records shows consistent with previous.  UA shows trace leukocytes, pyuria.  She is currently being treated for UTI.  Discussed results with patient.  I discussed with patient regarding further work-up here in the ED.  We engaged in shared decision making regarding whether or not she needed CT abdomen pelvis for further evaluation.  Patient states that her symptoms feel similar to when she had diverticulitis previous.  She actually reports that she is feeling better since yesterday.  Patient states that at this time, she does not want a CT abdomen pelvis and would rather be treated for diverticulitis and follow-up with primary care doctor.  At this time, patient is well-appearing, vitals are stable.  I feel this is reasonable.  Patient states that she does not want to be treated with Cipro as she has had reactions with that in the past.  I discussed with her regarding Flagyl versus Augmentin.  Patient states she would rather do Flagyl.  Additionally, she has been switched to amoxicillin for her UTI as she did not tolerate Macrobid.  I discussed with patient regarding only doing Augmentin for both diverticulitis and UTI but patient states she would rather have the Flagyl and amoxicillin.  Instructed patient to follow-up with her primary care doctor. At this time, patient exhibits no emergent life-threatening condition that require further evaluation in ED or admission. Patient had ample opportunity for questions and discussion. All patient's questions were answered with full understanding. Strict return precautions discussed. Patient expresses understanding and agreement to plan.    Portions of this note were generated with Scientist, clinical (histocompatibility and immunogenetics)Dragon dictation software. Dictation errors may occur despite best attempts at proofreading.   Final Clinical Impressions(s) / ED Diagnoses   Final diagnoses:  Left lower quadrant abdominal pain  Diverticulitis    ED Discharge  Orders         Ordered    metroNIDAZOLE (FLAGYL) 500 MG tablet  2 times daily     10/07/18 1856    amoxicillin (AMOXIL) 500 MG capsule  2 times daily     10/07/18 1856           Maxwell Caul, PA-C 10/07/18 1919    Vanetta Mulders, MD 10/08/18 [phone]

## 2018-10-07 NOTE — ED Triage Notes (Signed)
Pt reports lower abd pain yesterday, with diarrhea multiple episodes yesterday. Hx of diverticulitis. She also has a UTI and took macrobid which made her lips and mouth "feel warm" ?allergic reaction. Her doctor called in amoxicillin which she has not yet started. She is here today bc of continued LLQ abd pain

## 2018-10-07 NOTE — Discharge Instructions (Signed)
Take antibiotics as directed. Please take all of your antibiotics until finished.  Follow-up with your primary care doctor.  Return emergency department for any worsening pain, fever, vomiting or any other worsening or concerning symptoms.

## 2019-03-30 DIAGNOSIS — R05 Cough: Secondary | ICD-10-CM | POA: Diagnosis not present

## 2019-03-30 DIAGNOSIS — M7989 Other specified soft tissue disorders: Secondary | ICD-10-CM | POA: Diagnosis not present

## 2019-03-30 DIAGNOSIS — L439 Lichen planus, unspecified: Secondary | ICD-10-CM | POA: Diagnosis not present

## 2019-03-30 DIAGNOSIS — R918 Other nonspecific abnormal finding of lung field: Secondary | ICD-10-CM | POA: Diagnosis not present

## 2019-03-30 DIAGNOSIS — R7989 Other specified abnormal findings of blood chemistry: Secondary | ICD-10-CM | POA: Diagnosis not present

## 2019-04-20 DIAGNOSIS — I10 Essential (primary) hypertension: Secondary | ICD-10-CM | POA: Diagnosis not present

## 2019-05-25 DIAGNOSIS — R8290 Unspecified abnormal findings in urine: Secondary | ICD-10-CM | POA: Diagnosis not present

## 2019-05-26 DIAGNOSIS — Z23 Encounter for immunization: Secondary | ICD-10-CM | POA: Diagnosis not present

## 2019-06-04 DIAGNOSIS — R0681 Apnea, not elsewhere classified: Secondary | ICD-10-CM | POA: Diagnosis not present

## 2019-06-04 DIAGNOSIS — R0683 Snoring: Secondary | ICD-10-CM | POA: Diagnosis not present

## 2019-07-03 ENCOUNTER — Other Ambulatory Visit: Payer: Self-pay

## 2019-07-03 DIAGNOSIS — Z20822 Contact with and (suspected) exposure to covid-19: Secondary | ICD-10-CM

## 2019-07-04 LAB — NOVEL CORONAVIRUS, NAA: SARS-CoV-2, NAA: NOT DETECTED

## 2019-08-08 DIAGNOSIS — G4733 Obstructive sleep apnea (adult) (pediatric): Secondary | ICD-10-CM | POA: Diagnosis not present

## 2019-08-24 DIAGNOSIS — R300 Dysuria: Secondary | ICD-10-CM | POA: Diagnosis not present

## 2019-08-30 DIAGNOSIS — G4733 Obstructive sleep apnea (adult) (pediatric): Secondary | ICD-10-CM | POA: Diagnosis not present

## 2019-09-05 ENCOUNTER — Telehealth: Payer: Self-pay | Admitting: Pulmonary Disease

## 2019-09-05 NOTE — Telephone Encounter (Signed)
She will have to call her insurance company for that she prob has a deductable to meet and they can tell her what her portion of the scan will be Natasha Wilkinson

## 2019-09-05 NOTE — Telephone Encounter (Signed)
Called and spoke with pt letting her know the info stated by Almyra Free, [facility] that she needs to contact insurance company to figure out what her portion she would have to pay for the CT. Pt verbalized understanding. Nothing further needed.

## 2019-09-06 DIAGNOSIS — R300 Dysuria: Secondary | ICD-10-CM | POA: Diagnosis not present

## 2019-09-21 ENCOUNTER — Ambulatory Visit (INDEPENDENT_AMBULATORY_CARE_PROVIDER_SITE_OTHER)
Admission: RE | Admit: 2019-09-21 | Discharge: 2019-09-21 | Disposition: A | Discharge status: Home / Self Care | Payer: BC Managed Care – PPO | Source: Ambulatory Visit | Attending: Pulmonary Disease | Admitting: Pulmonary Disease

## 2019-09-21 ENCOUNTER — Inpatient Hospital Stay: Admission: RE | Admit: 2019-09-21 | Payer: BLUE CROSS/BLUE SHIELD | Source: Ambulatory Visit

## 2019-09-21 ENCOUNTER — Other Ambulatory Visit: Payer: Self-pay

## 2019-09-21 DIAGNOSIS — R911 Solitary pulmonary nodule: Secondary | ICD-10-CM

## 2019-09-21 DIAGNOSIS — R918 Other nonspecific abnormal finding of lung field: Secondary | ICD-10-CM | POA: Diagnosis not present

## 2019-09-26 DIAGNOSIS — N824 Other female intestinal-genital tract fistulae: Secondary | ICD-10-CM | POA: Diagnosis not present

## 2019-09-28 DIAGNOSIS — R918 Other nonspecific abnormal finding of lung field: Secondary | ICD-10-CM | POA: Diagnosis not present

## 2019-09-28 DIAGNOSIS — R7989 Other specified abnormal findings of blood chemistry: Secondary | ICD-10-CM | POA: Diagnosis not present

## 2019-09-28 DIAGNOSIS — L439 Lichen planus, unspecified: Secondary | ICD-10-CM | POA: Diagnosis not present

## 2019-09-28 DIAGNOSIS — R05 Cough: Secondary | ICD-10-CM | POA: Diagnosis not present

## 2019-10-02 DIAGNOSIS — K5730 Diverticulosis of large intestine without perforation or abscess without bleeding: Secondary | ICD-10-CM | POA: Diagnosis not present

## 2019-10-10 DIAGNOSIS — Z01419 Encounter for gynecological examination (general) (routine) without abnormal findings: Secondary | ICD-10-CM | POA: Diagnosis not present

## 2019-10-10 DIAGNOSIS — M8580 Other specified disorders of bone density and structure, unspecified site: Secondary | ICD-10-CM | POA: Diagnosis not present

## 2019-10-10 DIAGNOSIS — Z6836 Body mass index (BMI) 36.0-36.9, adult: Secondary | ICD-10-CM | POA: Diagnosis not present

## 2019-10-10 DIAGNOSIS — Z1231 Encounter for screening mammogram for malignant neoplasm of breast: Secondary | ICD-10-CM | POA: Diagnosis not present

## 2019-10-10 DIAGNOSIS — N952 Postmenopausal atrophic vaginitis: Secondary | ICD-10-CM | POA: Diagnosis not present

## 2019-10-26 DIAGNOSIS — R300 Dysuria: Secondary | ICD-10-CM | POA: Diagnosis not present

## 2020-11-03 ENCOUNTER — Encounter: Payer: Self-pay | Admitting: Pulmonary Disease

## 2020-11-03 NOTE — Progress Notes (Signed)
PCCM note  Received note from [facility] rheumatology from [place], [place] dated 10/03/2020 Patient is being followed for rheumatoid arthritis, positive CCP At this point she does not have any joint pain or swelling or stiffness to suggest active inflammatory arthritis.  Continue to monitor off treatment  Chilton Greathouse MD [facility] 11/03/2020, 9:40 AM

## 2021-11-20 ENCOUNTER — Ambulatory Visit (INDEPENDENT_AMBULATORY_CARE_PROVIDER_SITE_OTHER): Payer: 59 | Admitting: Pulmonary Disease

## 2021-11-20 ENCOUNTER — Encounter: Payer: Self-pay | Admitting: Pulmonary Disease

## 2021-11-20 ENCOUNTER — Ambulatory Visit (INDEPENDENT_AMBULATORY_CARE_PROVIDER_SITE_OTHER): Payer: 59

## 2021-11-20 VITALS — BP 120/62 | HR 65 | Temp 97.8°F | Ht 65.0 in | Wt 219.2 lb

## 2021-11-20 DIAGNOSIS — R911 Solitary pulmonary nodule: Secondary | ICD-10-CM

## 2021-11-20 NOTE — Patient Instructions (Signed)
We will get a chest x-ray today.  If there is any abnormality then will need follow-up imaging ?Return to clinic as needed ?

## 2021-11-20 NOTE — Progress Notes (Addendum)
? ?      ?Natasha Wilkinson    [id number]    [birth year] ? ?Primary Care Physician:Harris, Gwyndolyn Saxon, MD ? ?Referring Physician: Shirline Frees, MD ?[address] ?[address] ?[place], [place] [address] ? ?Chief complaint:   ?Follow up for lung nodules. ? ?HPI: ?Natasha Wilkinson is a 61 year old with past medical history as below. She underwent a CT of the abdomen to evaluate for diverticulitis in 2016. This showed basal pulmonary nodules. She had a follow-up CT scan last month which confirmed multiple pulmonary nodules. The basal nodule has been stable for the past 6 months. She's been sent to pulmonary clinic for further evaluation. ?  ?She is up-to-date with her cancer screening. She's had a colonoscopy last year which was incomplete due to difficulty passing the scope. She's currently being evaluated with stool cards and a repeat scope planned in 5 year. She's had a hysterectomy in 2015 for fibroids and uterine bleeding. She lived in [place] all her life with no travel except for a cruise to the [place] a few months ago. She's had no significant exposure to TB or other factors.  She is a non-smoker. ? ?Work-up showed positive CCP and she was sent to rheumatology and given a diagnosis of rheumatoid arthritis. ?  ?Received note from [facility] rheumatology from [place], [place] dated 10/03/2020 ?Patient is being followed for rheumatoid arthritis, positive CCP ?At this point she does not have any joint pain or swelling or stiffness to suggest active inflammatory arthritis.  Continue to monitor off treatment ? ?Interim history: ?Back in clinic after many years.  States that she is doing well.  Her rheumatoid arthritis is being monitored with no active treatment.  Breathing is stable with no issues ? ?Outpatient Encounter Medications as of 11/20/2021  ?Medication Sig  ? docusate sodium (COLACE) 100 MG capsule Stool Softener  ? famotidine (PEPCID) 20 MG tablet Take 20 mg by mouth daily.  ? fexofenadine (ALLEGRA) 180 MG tablet Take  180 mg by mouth daily.  ? fluticasone (FLONASE) 50 MCG/ACT nasal spray Place 1 spray into both nostrils daily.  ? Fluticasone Furoate (ARNUITY ELLIPTA) 100 MCG/ACT AEPB Inhale 1 puff daily into the lungs.  ? lisinopril-hydrochlorothiazide (PRINZIDE,ZESTORETIC) 20-12.5 MG tablet Take 1 tablet by mouth daily.  ? Magnesium 400 MG TABS magnesium  ? metoprolol succinate (TOPROL-XL) 50 MG 24 hr tablet Take 50 mg by mouth daily with breakfast.  ? Multiple Vitamin (MULTIVITAMIN) capsule Take 1 capsule by mouth daily.  ? [DISCONTINUED] metroNIDAZOLE (FLAGYL) 500 MG tablet Take 1 tablet (500 mg total) by mouth 2 (two) times daily.  ? [DISCONTINUED] VENTOLIN HFA 108 (90 Base) MCG/ACT inhaler inhale 2 puffs INTO THE LUNGS every 4 hours if needed  ? ?No facility-administered encounter medications on file as of 11/20/2021.  ? ?Physical Exam: ?Blood pressure 120/62, pulse 65, temperature 97.8 ?F (36.6 ?C), temperature source Oral, height $RemoveBefo'5\' 5"'ajtYoqTduYH$  (1.651 m), weight 219 lb 3.2 oz (99.4 kg), SpO2 97 %. ?Gen:      No acute distress ?HEENT:  EOMI, sclera anicteric ?Neck:     No masses; no thyromegaly ?Lungs:    Clear to auscultation bilaterally; normal respiratory effort ?CV:         Regular rate and rhythm; no murmurs ?Abd:      + bowel sounds; soft, non-tender; no palpable masses, no distension ?Ext:    No edema; adequate peripheral perfusion ?Skin:      Warm and dry; no rash ?Neuro: alert and oriented x  3 ?Psych: normal mood and affect  ? ?Data Reviewed: ?Imaging ?CT abd 03/09/15- Acute diverticulitis, hiatal hernia, multiple pulmonary nodules. ?  ?CT chest 09/12/15- Multiple pulmonary nodules. Largest one in the right lower lobe. Stable since CT of the abdomen from 2016. ? ?PET scan chest 10/23/15- Negative activity in pulmonary nodules, diverticulosis, renal stone, umbilical hernia, hepatic steatosis.  ? ?CT scan 09/21/2019-stable pulmonary nodules ? ?Labs: ?Pap smear 04/10/15 - Negative for malignancy ?Endometrial dilatation and curretage,  hysteroscopy 11/18/09 - Negative for malignancy. ?Colonoscopy  ?  ?10/15/15 ?Balso, Histo, cocci ab - Negative ?ESR 35 ?ANA- Neg ?Rheumatoid factor < 10 ?CCP > 250 ?c- ANCA 1:160 ?  ?  ?Assessment:  ?Eval for Multiple pulmonary nodules. ? ?Nodules are likely benign and related to her rheumatoid arthritis.  Nodules are remained stable from 2016 09/22/2019 4.  Of 5 years and she had a PET scan in 2017 which was negative ?Serologies for fungal disease such as cocci, blasto, histo is negative. She is healthy otherwise with no evidence of immunocompromise, travel history that may predispose to fungal, opportunistic infections. Her serologies positive for CCP and ANCA. She has seen Dr. Trudie Reed of Rheumatology but is not on treatment.  ?  ?I reassured the patient today.  We will get a chest x-ray to make sure there is no change and she can follow-up as needed ? ?Plan/Recommendations: ?-Chest x-ray ? ?Follow-up as needed. ? ?Marshell Garfinkel MD ?[facility] ?Pager [phone] ?11/20/2021, 10:54 AM ? ?CC: Shirline Frees, MD ? ? ? ?

## 2021-11-20 NOTE — Addendum Note (Signed)
Addended by: Jacquiline Doe on: 11/20/2021 11:10 AM ? ? Modules accepted: Orders ? ?

## 2023-02-25 ENCOUNTER — Other Ambulatory Visit: Payer: Self-pay | Admitting: Physician Assistant

## 2023-02-25 DIAGNOSIS — R198 Other specified symptoms and signs involving the digestive system and abdomen: Secondary | ICD-10-CM

## 2023-03-22 ENCOUNTER — Ambulatory Visit
Admission: RE | Admit: 2023-03-22 | Discharge: 2023-03-22 | Disposition: A | Discharge status: Home / Self Care | Payer: 59 | Source: Ambulatory Visit | Attending: Physician Assistant | Admitting: Physician Assistant

## 2023-03-22 DIAGNOSIS — R198 Other specified symptoms and signs involving the digestive system and abdomen: Secondary | ICD-10-CM

## 2023-03-22 MED ORDER — IOPAMIDOL (ISOVUE-300) INJECTION 61%
200.0000 mL | Freq: Once | INTRAVENOUS | Status: AC | PRN
  Administered 2023-03-22: 100 mL via INTRAVENOUS

## 2023-07-05 ENCOUNTER — Other Ambulatory Visit: Payer: Self-pay | Admitting: Urology

## 2023-07-31 ENCOUNTER — Emergency Department (HOSPITAL_COMMUNITY)
Admission: EM | Admit: 2023-07-31 | Discharge: 2023-08-01 | Disposition: A | Discharge status: Home / Self Care | Payer: 59 | Attending: Emergency Medicine | Admitting: Emergency Medicine

## 2023-07-31 DIAGNOSIS — R1084 Generalized abdominal pain: Secondary | ICD-10-CM | POA: Diagnosis not present

## 2023-07-31 DIAGNOSIS — I10 Essential (primary) hypertension: Secondary | ICD-10-CM | POA: Diagnosis not present

## 2023-07-31 DIAGNOSIS — R109 Unspecified abdominal pain: Secondary | ICD-10-CM | POA: Diagnosis present

## 2023-07-31 DIAGNOSIS — K429 Umbilical hernia without obstruction or gangrene: Secondary | ICD-10-CM | POA: Insufficient documentation

## 2023-07-31 DIAGNOSIS — Z91010 Allergy to peanuts: Secondary | ICD-10-CM | POA: Diagnosis not present

## 2023-07-31 DIAGNOSIS — D72829 Elevated white blood cell count, unspecified: Secondary | ICD-10-CM | POA: Insufficient documentation

## 2023-07-31 LAB — CBC
HCT: 40.9 % (ref 36.0–46.0)
Hemoglobin: 14.1 g/dL (ref 12.0–15.0)
MCH: 30.7 pg (ref 26.0–34.0)
MCHC: 34.5 g/dL (ref 30.0–36.0)
MCV: 88.9 fL (ref 80.0–100.0)
Platelets: 284 10*3/uL (ref 150–400)
RBC: 4.6 MIL/uL (ref 3.87–5.11)
RDW: 13.6 % (ref 11.5–15.5)
WBC: 15.3 10*3/uL — ABNORMAL HIGH (ref 4.0–10.5)
nRBC: 0 % (ref 0.0–0.2)

## 2023-07-31 LAB — COMPREHENSIVE METABOLIC PANEL
ALT: 19 U/L (ref 0–44)
AST: 23 U/L (ref 15–41)
Albumin: 4.5 g/dL (ref 3.5–5.0)
Alkaline Phosphatase: 76 U/L (ref 38–126)
Anion gap: 15 (ref 5–15)
BUN: 15 mg/dL (ref 8–23)
CO2: 20 mmol/L — ABNORMAL LOW (ref 22–32)
Calcium: 9.6 mg/dL (ref 8.9–10.3)
Chloride: 100 mmol/L (ref 98–111)
Creatinine, Ser: 0.65 mg/dL (ref 0.44–1.00)
GFR, Estimated: >60 mL/min (ref >60)
Glucose, Bld: 138 mg/dL — ABNORMAL HIGH (ref 70–99)
Potassium: 3.8 mmol/L (ref 3.5–5.1)
Sodium: 135 mmol/L (ref 135–145)
Total Bilirubin: 1 mg/dL (ref <1.2)
Total Protein: 8.6 g/dL — ABNORMAL HIGH (ref 6.5–8.1)

## 2023-07-31 LAB — LIPASE, BLOOD: Lipase: 23 U/L (ref 11–51)

## 2023-07-31 NOTE — ED Triage Notes (Signed)
Patient arrived with complaints of abdominal pain that started today, states she is scheduled for surgery at the end of the month due to diverticulitis. Complaints of NVD. Referred by surgeon to rule out a blockage.

## 2023-07-31 NOTE — ED Notes (Signed)
Pt. Walks to the bathroom independently to give urine sample.

## 2023-07-31 NOTE — ED Notes (Signed)
Pt had cup of ice water minute before vitals were taken

## 2023-08-01 ENCOUNTER — Emergency Department (HOSPITAL_COMMUNITY): Payer: 59

## 2023-08-01 LAB — URINALYSIS, ROUTINE W REFLEX MICROSCOPIC
Bilirubin Urine: NEGATIVE
Glucose, UA: NEGATIVE mg/dL
Hgb urine dipstick: NEGATIVE
Ketones, ur: NEGATIVE mg/dL
Nitrite: NEGATIVE
Protein, ur: NEGATIVE mg/dL
Specific Gravity, Urine: 1.013 (ref 1.005–1.030)
pH: 6 (ref 5.0–8.0)

## 2023-08-01 MED ORDER — IOHEXOL 300 MG/ML  SOLN
100.0000 mL | Freq: Once | INTRAMUSCULAR | Status: AC | PRN
  Administered 2023-08-01: 100 mL via INTRAVENOUS

## 2023-08-01 MED ORDER — ONDANSETRON HCL 4 MG/2ML IJ SOLN
4.0000 mg | Freq: Once | INTRAMUSCULAR | Status: AC
  Administered 2023-08-01: 4 mg via INTRAVENOUS
  Filled 2023-08-01: qty 2

## 2023-08-01 NOTE — ED Notes (Signed)
Patient given 2 packs of saltine crackers and sprite for PO challenge

## 2023-08-01 NOTE — Discharge Instructions (Addendum)
You were seen today for abdominal pain and vomiting.  Your CT scan does show an abnormal segment of sigmoid colon; however there is no evidence of acute inflammation or infection.  You also have a hernia that may have induced your symptoms.  However given that you are improved and your exam is benign, feel that you can follow-up closely with Dr. Cliffton Asters.  Restart your MiraLAX as prescribed previously.

## 2023-08-01 NOTE — ED Provider Notes (Signed)
[place] EMERGENCY DEPARTMENT AT [facility] Provider Note   CSN: [id number] Arrival date & time: 07/31/23  1849     History  Chief Complaint  Patient presents with   Abdominal Pain    Natasha Wilkinson is a 62 y.o. female.  HPI     This is a 62 year old female with history of diverticulitis scheduled for colectomy at the end of the month who presents with abdominal pain, nausea, vomiting, and diarrhea.  Onset of symptoms today.  Patient does report that she is supposed to take MiraLAX daily but has taken a short hiatus over the last 2 to 3 days because of crampy symptoms she relates to MiraLAX.  She states that she had mid abdominal discomfort earlier today with nonbilious, nonbloody emesis.  She has had 2 loose stools.  No other known sick contacts.  Patient also reports abdominal hernia.  Discussed with her surgeon who referred her here for further evaluation.  Home Medications Prior to Admission medications   Medication Sig Start Date End Date Taking? Authorizing Provider  docusate sodium (COLACE) 100 MG capsule Stool Softener    [provider]  famotidine (PEPCID) 20 MG tablet Take 20 mg by mouth daily.    [provider]  fexofenadine (ALLEGRA) 180 MG tablet Take 180 mg by mouth daily.    [provider]  fluticasone (FLONASE) 50 MCG/ACT nasal spray Place 1 spray into both nostrils daily. 11/26/16   Coralyn Helling, MD  Fluticasone Furoate (ARNUITY ELLIPTA) 100 MCG/ACT AEPB Inhale 1 puff daily into the lungs. 06/15/17   Mannam, Colbert Coyer, MD  lisinopril-hydrochlorothiazide (PRINZIDE,ZESTORETIC) 20-12.5 MG tablet Take 1 tablet by mouth daily. 07/08/16   [provider]  Magnesium 400 MG TABS magnesium    [provider]  metoprolol succinate (TOPROL-XL) 50 MG 24 hr tablet Take 50 mg by mouth daily with breakfast. 02/14/15   [provider]  Multiple Vitamin (MULTIVITAMIN) capsule Take 1 capsule by mouth daily.     [provider]      Allergies    Peanut-containing drug products, Ciprofloxacin, Hydrocodone-acetaminophen, Losartan, Norvasc [amlodipine besylate], and Sulfa antibiotics    Review of Systems   Review of Systems  Gastrointestinal:  Positive for abdominal pain, nausea and vomiting. Negative for constipation and diarrhea.  All other systems reviewed and are negative.   Physical Exam Updated Vital Signs BP 116/83 (BP Location: Right Arm)   Pulse 87   Temp 98.6 F (37 C) (Oral)   Resp 18   SpO2 99%  Physical Exam Vitals and nursing note reviewed.  Constitutional:      Appearance: She is well-developed. She is obese. She is not ill-appearing.  HENT:     Head: Normocephalic and atraumatic.  Eyes:     Pupils: Pupils are equal, round, and reactive to light.  Cardiovascular:     Rate and Rhythm: Normal rate and regular rhythm.     Heart sounds: Normal heart sounds.  Pulmonary:     Effort: Pulmonary effort is normal. No respiratory distress.     Breath sounds: No wheezing.  Abdominal:     General: Bowel sounds are normal.     Palpations: Abdomen is soft.     Tenderness: There is abdominal tenderness. There is no guarding or rebound.     Hernia: A hernia is present. Hernia is present in the umbilical area.     Comments: Umbilical hernia palpated, reducible, no overlying skin changes  Musculoskeletal:     Cervical  back: Neck supple.  Skin:    General: Skin is warm and dry.  Neurological:     Mental Status: She is alert and oriented to person, place, and time.     ED Results / Procedures / Treatments   Labs (all labs ordered are listed, but only abnormal results are displayed) Labs Reviewed  COMPREHENSIVE METABOLIC PANEL - Abnormal; Notable for the following components:      Result Value   CO2 20 (*)    Glucose, Bld 138 (*)    Total Protein 8.6 (*)    All other components within normal limits  CBC - Abnormal; Notable for the following components:   WBC 15.3  (*)    All other components within normal limits  URINALYSIS, ROUTINE W REFLEX MICROSCOPIC - Abnormal; Notable for the following components:   Leukocytes,Ua MODERATE (*)    Bacteria, UA FEW (*)    All other components within normal limits  LIPASE, BLOOD    EKG None  Radiology CT ABDOMEN PELVIS W CONTRAST Result Date: 08/01/2023 CLINICAL DATA:  Abdominal pain, acute, nonlocalized Patient arrived with complaints of abdominal pain that started today, states she is scheduled for surgery at the end of the month due to diverticulitis. Complaints of NVD. Referred by surgeon to rule out a blockage. EXAM: CT ABDOMEN AND PELVIS WITH CONTRAST TECHNIQUE: Multidetector CT imaging of the abdomen and pelvis was performed using the standard protocol following bolus administration of intravenous contrast. RADIATION DOSE REDUCTION: This exam was performed according to the departmental dose-optimization program which includes automated exposure control, adjustment of the mA and/or kV according to patient size and/or use of iterative reconstruction technique. CONTRAST:  OMNIPAQUE IOHEXOL 300 MG/ML  SOLN COMPARISON:  CT abdomen pelvis 03/22/2023 FINDINGS: Lower chest: Stable chronic subpleural rounded polyp 1.4 cm subpleural nodule within the right lower lobe (5:19). Several scattered stable chronic subpleural micronodules within the bilateral lower lobes. Small hiatal hernia. Hepatobiliary: No focal liver abnormality. Status post cholecystectomy. No biliary dilatation. Pancreas: No focal lesion. Normal pancreatic contour. No surrounding inflammatory changes. No main pancreatic ductal dilatation. Spleen: Normal in size without focal abnormality. Adrenals/Urinary Tract: No adrenal nodule bilaterally. Bilateral kidneys enhance symmetrically. Right nephrolithiasis measuring up to 4 mm. No left nephrolithiasis. No ureterolithiasis bilaterally. No hydronephrosis. No hydroureter. The urinary bladder is unremarkable.  Stomach/Bowel: Stomach is within normal limits. No evidence of small bowel wall thickening or dilatation. Question bowel wall thickening of a short segment of sigmoid colon with no associated inflammatory changes. Stool noted throughout the colon proximal to this short segment of sigmoid bowel. Colonic diverticulosis. The appendix is not definitely identified with no inflammatory changes in the right lower quadrant to suggest acute appendicitis. Vascular/Lymphatic: No abdominal aorta or iliac aneurysm. Mild atherosclerotic plaque of the aorta and its branches. No abdominal, pelvic, or inguinal lymphadenopathy. Reproductive: Status post hysterectomy. No adnexal masses. Other: Trace simple free fluid within the supraumbilical ventral wall hernia. No intraperitoneal free gas. No organized fluid collection. Musculoskeletal: Redemonstration of a supraumbilical ventral hernia with an abdominal defect of 5.2 cm now containing a short segment of small bowel. Associated distension of the small bowel proximally measuring up to 2.8 cm with associated fat stranding and free fluid within the hernia. Superiorly there are a couple of small fat containing supraumbilical ventral wall hernias with abdominal defect of 1.6 and 2 cm. No suspicious lytic or blastic osseous lesions. No acute displaced fracture. Multilevel degenerative changes of the spine. IMPRESSION: 1. Question bowel wall thickening  of the sigmoid colon with no associated inflammatory changes to suggest diverticulitis or colitis. Stool noted throughout the colon proximal to this short segment of sigmoid bowel. No colonic dilatation suggest bowel obstruction. Recommend colonoscopy for further evaluation of possible underlying malignancy. 2. Redemonstration of a supraumbilical ventral hernia with an abdominal defect of 5.2 cm now containing a short segment of small bowel. Associated distension of the small bowel proximally measuring up to 2.8 cm with associated fat  stranding and free fluid within the hernia. No definite bowel obstruction; however, developing partial obstruction is not excluded given bowel distension proximally. Correlate clinically for incarceration. Superiorly there are a couple of small fat containing supraumbilical ventral wall hernias with abdominal defect of 1.6 and 2 cm. 3. Colonic diverticulosis with no acute diverticulitis. 4. Small hiatal hernia. 5. Nonobstructive right nephrolithiasis measuring up to 4 mm. 6.  Aortic Atherosclerosis (ICD10-I70.0). 7. Stable chronic pulmonary nodules-no further follow-up indicated. Electronically Signed   By: Tish Frederickson M.D.   On: 08/01/2023 01:29    Procedures Procedures    Medications Ordered in ED Medications  ondansetron (ZOFRAN) injection 4 mg (4 mg Intravenous Given 08/01/23 0059)  iohexol (OMNIPAQUE) 300 MG/ML solution 100 mL (100 mLs Intravenous Contrast Given 08/01/23 0047)    ED Course/ Medical Decision Making/ A&P                                 Medical Decision Making Amount and/or Complexity of Data Reviewed Labs: ordered. Radiology: ordered.  Risk Prescription drug management.   This patient presents to the ED for concern of abdominal pain, nausea, vomiting, this involves an extensive number of treatment options, and is a complaint that carries with it a high risk of complications and morbidity.  I considered the following differential and admission for this acute, potentially life threatening condition.  The differential diagnosis includes SBO, diverticulitis, colitis, appendicitis  MDM:    This is a 62 year old female who presents with abdominal pain, nausea, vomiting.  History of diverticulitis and abdominal hernia.  She is nontoxic.  Vital signs are reassuring.  She does describe symptoms that could be concerning for obstruction.  She has a palpable hernia but it is reducible on exam without signs of strangulation or incarceration.  Labs obtained and reviewed.   Slight leukocytosis but otherwise reassuring.  CT scan obtained.  CT scan does show an abnormal segment of sigmoid colon which is likely the culprit that will be removed later this month.  No evidence of diverticulitis.  She also has a umbilical hernia with a small portion of small bowel with potentially a partial obstruction.  Hernia was reproducible on exam clinically.  Patient has not had recurrent emesis and actually states that she feels better.  She is able to tolerate fluids.  This could likely have been the culprit of her symptoms; however do not feel like it is persistent clinically.  Feel is reasonable that she be discharged home.  Recommend that she reinitiate her bowel regimen and follow-up closely with Dr. Cliffton Asters.  (Labs, imaging, consults)  Labs: I Ordered, and personally interpreted labs.  The pertinent results include: CBC, CMP, lipase  Imaging Studies ordered: I ordered imaging studies including CT I independently visualized and interpreted imaging. I agree with the radiologist interpretation  Additional history obtained from chart review.  External records from outside source obtained and reviewed including prior evaluations  Cardiac Monitoring: The patient was maintained on  a cardiac monitor.  If on the cardiac monitor, I personally viewed and interpreted the cardiac monitored which showed an underlying rhythm of: Sinus  Reevaluation: After the interventions noted above, I reevaluated the patient and found that they have :resolved  Social Determinants of Health:  lives independently  Disposition: Discharge  Co morbidities that complicate the patient evaluation  Past Medical History:  Diagnosis Date   Diverticulitis    Fistula    Hemorrhoid    Hypercholesteremia    Hypertension    Lichenoid dermatitis    Obesity    Seasonal allergies      Medicines Meds ordered this encounter  Medications   ondansetron (ZOFRAN) injection 4 mg   iohexol (OMNIPAQUE) 300 MG/ML  solution 100 mL    I have reviewed the patients home medicines and have made adjustments as needed  Problem List / ED Course: Problem List Items Addressed This Visit   None Visit Diagnoses       Generalized abdominal pain    -  Primary                   Final Clinical Impression(s) / ED Diagnoses Final diagnoses:  Generalized abdominal pain    Rx / DC Orders ED Discharge Orders     None         Shon Baton, MD 08/01/23 [phone]

## 2023-08-16 ENCOUNTER — Ambulatory Visit: Payer: Self-pay | Admitting: Surgery

## 2023-08-16 DIAGNOSIS — Z01818 Encounter for other preprocedural examination: Secondary | ICD-10-CM

## 2023-08-16 NOTE — Progress Notes (Addendum)
Anesthesia Review:  ZOX:WRUEAVW Harris  Cardiologist : none  Pulmonology- DR Mannam LOV 11/20/21  Chest x-ray : EKG : 08/23/23  Echo : Stress test: Cardiac Cath :  Activity level: can do a flight of stairs without difficulty  Sleep Study/ CPAP : has cpap  Fasting Blood Sugar :      / Checks Blood Sugar -- times a day:   Blood Thinner/ Instructions /Last Dose: ASA / Instructions/ Last Dose :    07/31/23- cbc , cmp and u/a in epic - white count- 15.3  07/31/23- IN ED - Abdominal pain  Labs repeated at preop on 08/23/23.     PT came in on 08/23/23 for preop.  Pt had her scripts with her for Neomycin and Flagyl for bowel prep per surgeon .  pT unsure if she has bowel prep instructions at home.  Called [facility] and spoke with [facility] and [facility] email to preop bowel prep instructions.  Copy of bowel prep given to pt and copy placed on chart.  Reviewed with pt .  PT aware if she has any questions she is to call [facility].    PT given consent form to sign for DR Marlou Porch part of surgery.  Unfamiliar with DR Marlou Porch .  Pt states she has ? Seen Dr Pete Glatter in past.  PT did not sign consent form for DR Marlou Porch  PT is going to check with DR Stoneking to make sure she has seen him in past and to speak with DR Cliffton Asters about Dr Pete Glatter doing the urology part of surgery.  PT has phone number to call for Triage at [facility].  [phone].  Consent form for Urology part will need to be signed DOS.

## 2023-08-17 NOTE — Patient Instructions (Addendum)
SURGICAL WAITING ROOM VISITATION  Patients having surgery or a procedure may have no more than 2 support people in the waiting area - these visitors may rotate.    Children under the age of 62 must have an adult with them who is not the patient.  Due to an increase in RSV and influenza rates and associated hospitalizations, children ages 43 and under may not visit patients in [facility] hospitals.  Visitors with respiratory illnesses are discouraged from visiting and should remain at home.  If the patient needs to stay at the hospital during part of their recovery, the visitor guidelines for inpatient rooms apply. Pre-op nurse will coordinate an appropriate time for 1 support person to accompany patient in pre-op.  This support person may not rotate.    Please refer to the [facility] website for the visitor guidelines for Inpatients (after your surgery is over and you are in a regular room).       Your procedure is scheduled on:  08/31/2023    Report to [facility] Main Entrance    Report to admitting at   [phone]   Call this number if you have problems the morning of surgery [phone] Clear liquid diet the day before surgery.              Follow bowel prep instructioins per MD.    After Midnight you may have the following liquids until _ 0530_____ AM  DAY OF SURGERY  Water Non-Citrus Juices (without pulp, NO RED-Apple, White grape, White cranberry) Black Coffee (NO MILK/CREAM OR CREAMERS, sugar ok)  Clear Tea (NO MILK/CREAM OR CREAMERS, sugar ok) regular and decaf                             Plain Jell-O (NO RED)                                           Fruit ices (not with fruit pulp, NO RED)                                     Popsicles (NO RED)                                                               Sports drinks like Gatorade (NO RED)                          Drink 2 presurgery Ensure drinks at 1000 pm the nite before surgery.      The day of surgery:   Drink ONE (1) Pre-Surgery Clear Ensure or G2 at 0530 AM the morning of surgery. Drink in one sitting. Do not sip.  This drink was given to you during your hospital  pre-op appointment visit. Nothing else to drink after completing the  Pre-Surgery Clear Ensure or G2.          If you have questions, please contact your surgeon's office.   FOLLOW BOWEL PREP AND ANY ADDITIONAL PRE OP INSTRUCTIONS YOU  RECEIVED FROM YOUR SURGEON'S OFFICE!!!     Oral Hygiene is also important to reduce your risk of infection.                                    Remember - BRUSH YOUR TEETH THE MORNING OF SURGERY WITH YOUR REGULAR TOOTHPASTE  DENTURES WILL BE REMOVED PRIOR TO SURGERY PLEASE DO NOT APPLY "Poly grip" OR ADHESIVES!!!   Do NOT smoke after Midnight   Stop all vitamins and herbal supplements 7 days before surgery.   Take these medicines the morning of surgery with A SIP OF WATER:   DO NOT TAKE ANY ORAL DIABETIC MEDICATIONS DAY OF YOUR SURGERY  Bring CPAP mask and tubing day of surgery.                              You may not have any metal on your body including hair pins, jewelry, and body piercing             Do not wear make-up, lotions, powders, perfumes/cologne, or deodorant  Do not wear nail polish including gel and S&S, artificial/acrylic nails, or any other type of covering on natural nails including finger and toenails. If you have artificial nails, gel coating, etc. that needs to be removed by a nail salon please have this removed prior to surgery or surgery may need to be canceled/ delayed if the surgeon/ anesthesia feels like they are unable to be safely monitored.   Do not shave  48 hours prior to surgery.               Men may shave face and neck.   Do not bring valuables to the hospital. [facility] IS NOT             RESPONSIBLE   FOR VALUABLES.   Contacts, glasses, dentures or bridgework may not be worn into surgery.   Bring small overnight bag day of surgery.   DO NOT  BRING YOUR HOME MEDICATIONS TO THE HOSPITAL. PHARMACY WILL DISPENSE MEDICATIONS LISTED ON YOUR MEDICATION LIST TO YOU DURING YOUR ADMISSION IN THE HOSPITAL!    Patients discharged on the day of surgery will not be allowed to drive home.  Someone NEEDS to stay with you for the first 24 hours after anesthesia.   Special Instructions: Bring a copy of your healthcare power of attorney and living will documents the day of surgery if you haven't scanned them before.              Please read over the following fact sheets you were given: IF YOU HAVE QUESTIONS ABOUT YOUR PRE-OP INSTRUCTIONS PLEASE CALL [phone]   If you received a COVID test during your pre-op visit  it is requested that you wear a mask when out in public, stay away from anyone that may not be feeling well and notify your surgeon if you develop symptoms. If you test positive for Covid or have been in contact with anyone that has tested positive in the last 10 days please notify you surgeon.    [facility] - Preparing for Surgery Before surgery, you can play an important role.  Because skin is not sterile, your skin needs to be as free of germs as possible.  You can reduce the number of germs on your skin by washing with CHG (chlorahexidine gluconate) soap  before surgery.  CHG is an antiseptic cleaner which kills germs and bonds with the skin to continue killing germs even after washing. Please DO NOT use if you have an allergy to CHG or antibacterial soaps.  If your skin becomes reddened/irritated stop using the CHG and inform your nurse when you arrive at Short Stay. Do not shave (including legs and underarms) for at least 48 hours prior to the first CHG shower.  You may shave your face/neck. Please follow these instructions carefully:  1.  Shower with CHG Soap the night before surgery and the  morning of Surgery.  2.  If you choose to wash your hair, wash your hair first as usual with your  normal  shampoo.  3.  After you shampoo,  rinse your hair and body thoroughly to remove the  shampoo.                           4.  Use CHG as you would any other liquid soap.  You can apply chg directly  to the skin and wash                       Gently with a scrungie or clean washcloth.  5.  Apply the CHG Soap to your body ONLY FROM THE NECK DOWN.   Do not use on face/ open                           Wound or open sores. Avoid contact with eyes, ears mouth and genitals (private parts).                       Wash face,  Genitals (private parts) with your normal soap.             6.  Wash thoroughly, paying special attention to the area where your surgery  will be performed.  7.  Thoroughly rinse your body with warm water from the neck down.  8.  DO NOT shower/wash with your normal soap after using and rinsing off  the CHG Soap.                9.  Pat yourself dry with a clean towel.            10.  Wear clean pajamas.            11.  Place clean sheets on your bed the night of your first shower and do not  sleep with pets. Day of Surgery : Do not apply any lotions/deodorants the morning of surgery.  Please wear clean clothes to the hospital/surgery center.  FAILURE TO FOLLOW THESE INSTRUCTIONS MAY RESULT IN THE CANCELLATION OF YOUR SURGERY PATIENT SIGNATURE_________________________________  NURSE SIGNATURE__________________________________  ________________________________________________________________________

## 2023-08-23 ENCOUNTER — Other Ambulatory Visit: Payer: Self-pay

## 2023-08-23 ENCOUNTER — Encounter (HOSPITAL_COMMUNITY): Payer: Self-pay

## 2023-08-23 ENCOUNTER — Encounter (HOSPITAL_COMMUNITY)
Admission: RE | Admit: 2023-08-23 | Discharge: 2023-08-23 | Disposition: A | Discharge status: Home / Self Care | Payer: 59 | Source: Ambulatory Visit | Attending: Surgery | Admitting: Surgery

## 2023-08-23 DIAGNOSIS — R9431 Abnormal electrocardiogram [ECG] [EKG]: Secondary | ICD-10-CM | POA: Insufficient documentation

## 2023-08-23 DIAGNOSIS — Z01818 Encounter for other preprocedural examination: Secondary | ICD-10-CM | POA: Diagnosis present

## 2023-08-23 HISTORY — DX: Sleep apnea, unspecified: G47.30

## 2023-08-23 LAB — CBC WITH DIFFERENTIAL/PLATELET
Abs Immature Granulocytes: 0.03 10*3/uL (ref 0.00–0.07)
Basophils Absolute: 0 10*3/uL (ref 0.0–0.1)
Basophils Relative: 0 %
Eosinophils Absolute: 0.2 10*3/uL (ref 0.0–0.5)
Eosinophils Relative: 3 %
HCT: 38.7 % (ref 36.0–46.0)
Hemoglobin: 12.6 g/dL (ref 12.0–15.0)
Immature Granulocytes: 1 %
Lymphocytes Relative: 23 %
Lymphs Abs: 1.2 10*3/uL (ref 0.7–4.0)
MCH: 29.9 pg (ref 26.0–34.0)
MCHC: 32.6 g/dL (ref 30.0–36.0)
MCV: 91.9 fL (ref 80.0–100.0)
Monocytes Absolute: 0.6 10*3/uL (ref 0.1–1.0)
Monocytes Relative: 11 %
Neutro Abs: 3.3 10*3/uL (ref 1.7–7.7)
Neutrophils Relative %: 62 %
Platelets: 227 10*3/uL (ref 150–400)
RBC: 4.21 MIL/uL (ref 3.87–5.11)
RDW: 13.7 % (ref 11.5–15.5)
WBC: 5.3 10*3/uL (ref 4.0–10.5)
nRBC: 0 % (ref 0.0–0.2)

## 2023-08-23 LAB — BASIC METABOLIC PANEL
Anion gap: 9 (ref 5–15)
BUN: 15 mg/dL (ref 8–23)
CO2: 23 mmol/L (ref 22–32)
Calcium: 9.5 mg/dL (ref 8.9–10.3)
Chloride: 103 mmol/L (ref 98–111)
Creatinine, Ser: 0.63 mg/dL (ref 0.44–1.00)
GFR, Estimated: >60 mL/min (ref >60)
Glucose, Bld: 96 mg/dL (ref 70–99)
Potassium: 4 mmol/L (ref 3.5–5.1)
Sodium: 135 mmol/L (ref 135–145)

## 2023-08-31 ENCOUNTER — Inpatient Hospital Stay (HOSPITAL_COMMUNITY)
Admission: RE | Admit: 2023-08-31 | Discharge: 2023-09-09 | DRG: 330 | Disposition: A | Discharge status: Home Health | Payer: 59 | Attending: Surgery | Admitting: Surgery

## 2023-08-31 ENCOUNTER — Inpatient Hospital Stay (HOSPITAL_COMMUNITY): Payer: 59 | Admitting: Medical

## 2023-08-31 ENCOUNTER — Other Ambulatory Visit: Payer: Self-pay

## 2023-08-31 ENCOUNTER — Encounter (HOSPITAL_COMMUNITY): Payer: Self-pay | Admitting: Surgery

## 2023-08-31 ENCOUNTER — Encounter (HOSPITAL_COMMUNITY)
Admission: RE | Disposition: A | Discharge status: Home Health | Payer: Self-pay | Source: Home / Self Care | Attending: Surgery

## 2023-08-31 ENCOUNTER — Inpatient Hospital Stay (HOSPITAL_COMMUNITY): Payer: 59 | Admitting: Anesthesiology

## 2023-08-31 DIAGNOSIS — K56600 Partial intestinal obstruction, unspecified as to cause: Secondary | ICD-10-CM | POA: Diagnosis not present

## 2023-08-31 DIAGNOSIS — Z01818 Encounter for other preprocedural examination: Secondary | ICD-10-CM

## 2023-08-31 DIAGNOSIS — Z8042 Family history of malignant neoplasm of prostate: Secondary | ICD-10-CM | POA: Diagnosis not present

## 2023-08-31 DIAGNOSIS — Z83719 Family history of colon polyps, unspecified: Secondary | ICD-10-CM

## 2023-08-31 DIAGNOSIS — Z6837 Body mass index (BMI) 37.0-37.9, adult: Secondary | ICD-10-CM

## 2023-08-31 DIAGNOSIS — E876 Hypokalemia: Secondary | ICD-10-CM | POA: Diagnosis present

## 2023-08-31 DIAGNOSIS — E669 Obesity, unspecified: Secondary | ICD-10-CM | POA: Diagnosis present

## 2023-08-31 DIAGNOSIS — Z881 Allergy status to other antibiotic agents status: Secondary | ICD-10-CM

## 2023-08-31 DIAGNOSIS — Z933 Colostomy status: Principal | ICD-10-CM

## 2023-08-31 DIAGNOSIS — K5792 Diverticulitis of intestine, part unspecified, without perforation or abscess without bleeding: Secondary | ICD-10-CM | POA: Diagnosis not present

## 2023-08-31 DIAGNOSIS — N736 Female pelvic peritoneal adhesions (postinfective): Secondary | ICD-10-CM

## 2023-08-31 DIAGNOSIS — E7800 Pure hypercholesterolemia, unspecified: Secondary | ICD-10-CM | POA: Diagnosis present

## 2023-08-31 DIAGNOSIS — Z8249 Family history of ischemic heart disease and other diseases of the circulatory system: Secondary | ICD-10-CM

## 2023-08-31 DIAGNOSIS — Z888 Allergy status to other drugs, medicaments and biological substances status: Secondary | ICD-10-CM | POA: Diagnosis not present

## 2023-08-31 DIAGNOSIS — I10 Essential (primary) hypertension: Secondary | ICD-10-CM | POA: Diagnosis present

## 2023-08-31 DIAGNOSIS — G4733 Obstructive sleep apnea (adult) (pediatric): Secondary | ICD-10-CM | POA: Diagnosis not present

## 2023-08-31 DIAGNOSIS — Z90710 Acquired absence of both cervix and uterus: Secondary | ICD-10-CM

## 2023-08-31 DIAGNOSIS — Z8719 Personal history of other diseases of the digestive system: Principal | ICD-10-CM

## 2023-08-31 DIAGNOSIS — Z87442 Personal history of urinary calculi: Secondary | ICD-10-CM | POA: Diagnosis not present

## 2023-08-31 DIAGNOSIS — K430 Incisional hernia with obstruction, without gangrene: Secondary | ICD-10-CM | POA: Diagnosis present

## 2023-08-31 DIAGNOSIS — Z885 Allergy status to narcotic agent status: Secondary | ICD-10-CM | POA: Diagnosis not present

## 2023-08-31 DIAGNOSIS — Z882 Allergy status to sulfonamides status: Secondary | ICD-10-CM

## 2023-08-31 DIAGNOSIS — Z91010 Allergy to peanuts: Secondary | ICD-10-CM | POA: Diagnosis not present

## 2023-08-31 DIAGNOSIS — K5732 Diverticulitis of large intestine without perforation or abscess without bleeding: Secondary | ICD-10-CM | POA: Diagnosis present

## 2023-08-31 DIAGNOSIS — G4730 Sleep apnea, unspecified: Secondary | ICD-10-CM | POA: Diagnosis present

## 2023-08-31 HISTORY — PX: INCISIONAL HERNIA REPAIR: SHX193

## 2023-08-31 HISTORY — DX: Other specified postprocedural states: Z98.890

## 2023-08-31 HISTORY — PX: XI ROBOTIC ASSISTED LOWER ANTERIOR RESECTION: SHX6558

## 2023-08-31 HISTORY — PX: FLEXIBLE SIGMOIDOSCOPY: SHX5431

## 2023-08-31 HISTORY — DX: Nausea with vomiting, unspecified: R11.2

## 2023-08-31 LAB — ABO/RH: ABO/RH(D): O NEG

## 2023-08-31 LAB — TYPE AND SCREEN
ABO/RH(D): O NEG
Antibody Screen: NEGATIVE

## 2023-08-31 SURGERY — RESECTION, RECTUM, LOW ANTERIOR, ROBOT-ASSISTED
Anesthesia: General

## 2023-08-31 MED ORDER — ENSURE PRE-SURGERY PO LIQD: 592.0000 mL | Freq: Once | ORAL | Status: DC

## 2023-08-31 MED ORDER — CHLORHEXIDINE GLUCONATE CLOTH 2 % EX PADS: 6.0000 | MEDICATED_PAD | Freq: Once | CUTANEOUS | Status: DC

## 2023-08-31 MED ORDER — BUPIVACAINE-EPINEPHRINE 0.25% -1:200000 IJ SOLN
INTRAMUSCULAR | Status: AC
  Filled 2023-08-31: qty 1

## 2023-08-31 MED ORDER — BUPIVACAINE LIPOSOME 1.3 % IJ SUSP
INTRAMUSCULAR | Status: AC
  Filled 2023-08-31: qty 20

## 2023-08-31 MED ORDER — RISAQUAD PO CAPS
1.0000 | ORAL_CAPSULE | Freq: Every day | ORAL | Status: DC
  Administered 2023-08-31 – 2023-09-04 (×5): 1 via ORAL
  Filled 2023-08-31 (×5): qty 1

## 2023-08-31 MED ORDER — FENTANYL CITRATE PF 50 MCG/ML IJ SOSY: 25.0000 ug | PREFILLED_SYRINGE | INTRAMUSCULAR | Status: DC | PRN

## 2023-08-31 MED ORDER — 0.9 % SODIUM CHLORIDE (POUR BTL) OPTIME
TOPICAL | Status: DC | PRN
  Administered 2023-08-31: 1000 mL

## 2023-08-31 MED ORDER — ACETAMINOPHEN 500 MG PO TABS
1000.0000 mg | ORAL_TABLET | Freq: Four times a day (QID) | ORAL | Status: AC
  Administered 2023-08-31 – 2023-09-07 (×24): 1000 mg via ORAL
  Filled 2023-08-31 (×24): qty 2

## 2023-08-31 MED ORDER — ENSURE SURGERY PO LIQD
237.0000 mL | Freq: Two times a day (BID) | ORAL | Status: DC
  Administered 2023-09-01 – 2023-09-04 (×5): 237 mL via ORAL

## 2023-08-31 MED ORDER — ALBUMIN HUMAN 5 % IV SOLN: INTRAVENOUS | Status: DC | PRN

## 2023-08-31 MED ORDER — NEOMYCIN SULFATE 500 MG PO TABS: 1000.0000 mg | ORAL_TABLET | ORAL | Status: DC

## 2023-08-31 MED ORDER — LISINOPRIL 10 MG PO TABS
10.0000 mg | ORAL_TABLET | Freq: Every day | ORAL | Status: DC
  Administered 2023-09-01 – 2023-09-07 (×6): 10 mg via ORAL
  Filled 2023-08-31 (×7): qty 1

## 2023-08-31 MED ORDER — KETAMINE HCL 50 MG/5ML IJ SOSY
PREFILLED_SYRINGE | INTRAMUSCULAR | Status: AC
  Filled 2023-08-31: qty 5

## 2023-08-31 MED ORDER — IBUPROFEN 400 MG PO TABS
600.0000 mg | ORAL_TABLET | Freq: Four times a day (QID) | ORAL | Status: DC | PRN
  Administered 2023-08-31 – 2023-09-02 (×4): 600 mg via ORAL
  Filled 2023-08-31 (×4): qty 1

## 2023-08-31 MED ORDER — HYDROMORPHONE HCL 1 MG/ML IJ SOLN
0.5000 mg | INTRAMUSCULAR | Status: DC | PRN
  Administered 2023-09-05 (×6): 0.5 mg via INTRAVENOUS
  Filled 2023-08-31 (×7): qty 0.5

## 2023-08-31 MED ORDER — PHENYLEPHRINE 80 MCG/ML (10ML) SYRINGE FOR IV PUSH (FOR BLOOD PRESSURE SUPPORT)
PREFILLED_SYRINGE | INTRAVENOUS | Status: AC
  Filled 2023-08-31: qty 10

## 2023-08-31 MED ORDER — BUPIVACAINE-EPINEPHRINE (PF) 0.25% -1:200000 IJ SOLN
INTRAMUSCULAR | Status: DC | PRN
  Administered 2023-08-31: 50 mL

## 2023-08-31 MED ORDER — PHENYLEPHRINE HCL (PRESSORS) 10 MG/ML IV SOLN
INTRAVENOUS | Status: DC | PRN
  Administered 2023-08-31: 80 ug via INTRAVENOUS
  Administered 2023-08-31: 240 ug via INTRAVENOUS
  Administered 2023-08-31: 80 ug via INTRAVENOUS

## 2023-08-31 MED ORDER — ORAL CARE MOUTH RINSE: 15.0000 mL | Freq: Once | OROMUCOSAL | Status: AC

## 2023-08-31 MED ORDER — HEPARIN SODIUM (PORCINE) 5000 UNIT/ML IJ SOLN
5000.0000 [IU] | Freq: Three times a day (TID) | INTRAMUSCULAR | Status: DC
  Administered 2023-08-31 – 2023-09-09 (×26): 5000 [IU] via SUBCUTANEOUS
  Filled 2023-08-31 (×26): qty 1

## 2023-08-31 MED ORDER — TRAMADOL HCL 50 MG PO TABS
50.0000 mg | ORAL_TABLET | Freq: Four times a day (QID) | ORAL | Status: DC | PRN
  Administered 2023-08-31 – 2023-09-09 (×16): 50 mg via ORAL
  Filled 2023-08-31 (×17): qty 1

## 2023-08-31 MED ORDER — AMISULPRIDE (ANTIEMETIC) 5 MG/2ML IV SOLN
10.0000 mg | Freq: Once | INTRAVENOUS | Status: DC | PRN
Start: 2023-08-31 — End: 2023-08-31

## 2023-08-31 MED ORDER — MIDAZOLAM HCL 2 MG/2ML IJ SOLN
INTRAMUSCULAR | Status: AC
  Filled 2023-08-31: qty 2

## 2023-08-31 MED ORDER — LACTATED RINGERS IV SOLN: INTRAVENOUS | Status: DC | PRN

## 2023-08-31 MED ORDER — METOPROLOL SUCCINATE ER 50 MG PO TB24
50.0000 mg | ORAL_TABLET | Freq: Every day | ORAL | Status: DC
  Administered 2023-09-01 – 2023-09-06 (×6): 50 mg via ORAL
  Filled 2023-08-31 (×8): qty 1

## 2023-08-31 MED ORDER — ONDANSETRON HCL 4 MG PO TABS
4.0000 mg | ORAL_TABLET | Freq: Four times a day (QID) | ORAL | Status: DC | PRN
  Administered 2023-09-03 – 2023-09-05 (×5): 4 mg via ORAL
  Filled 2023-08-31 (×5): qty 1

## 2023-08-31 MED ORDER — KCL IN DEXTROSE-NACL 20-5-0.45 MEQ/L-%-% IV SOLN
INTRAVENOUS | Status: DC
  Filled 2023-08-31 (×2): qty 1000

## 2023-08-31 MED ORDER — SIMETHICONE 80 MG PO CHEW: 40.0000 mg | CHEWABLE_TABLET | Freq: Four times a day (QID) | ORAL | Status: DC | PRN

## 2023-08-31 MED ORDER — DIPHENHYDRAMINE HCL 50 MG/ML IJ SOLN: 12.5000 mg | Freq: Four times a day (QID) | INTRAMUSCULAR | Status: DC | PRN

## 2023-08-31 MED ORDER — HYDROCHLOROTHIAZIDE 12.5 MG PO TABS
12.5000 mg | ORAL_TABLET | Freq: Every day | ORAL | Status: DC
  Administered 2023-09-01 – 2023-09-07 (×6): 12.5 mg via ORAL
  Filled 2023-08-31 (×7): qty 1

## 2023-08-31 MED ORDER — BUPIVACAINE LIPOSOME 1.3 % IJ SUSP: 20.0000 mL | Freq: Once | INTRAMUSCULAR | Status: DC

## 2023-08-31 MED ORDER — ENSURE PRE-SURGERY PO LIQD: 296.0000 mL | Freq: Once | ORAL | Status: DC

## 2023-08-31 MED ORDER — DIPHENHYDRAMINE HCL 12.5 MG/5ML PO ELIX: 12.5000 mg | ORAL_SOLUTION | Freq: Four times a day (QID) | ORAL | Status: DC | PRN

## 2023-08-31 MED ORDER — FENTANYL CITRATE (PF) 100 MCG/2ML IJ SOLN
INTRAMUSCULAR | Status: AC
  Filled 2023-08-31: qty 2

## 2023-08-31 MED ORDER — HEPARIN SODIUM (PORCINE) 5000 UNIT/ML IJ SOLN
5000.0000 [IU] | Freq: Once | INTRAMUSCULAR | Status: AC
  Administered 2023-08-31: 5000 [IU] via SUBCUTANEOUS
  Filled 2023-08-31: qty 1

## 2023-08-31 MED ORDER — ALUM & MAG HYDROXIDE-SIMETH 200-200-20 MG/5ML PO SUSP: 30.0000 mL | Freq: Four times a day (QID) | ORAL | Status: DC | PRN

## 2023-08-31 MED ORDER — HYDROMORPHONE HCL 2 MG/ML IJ SOLN
INTRAMUSCULAR | Status: AC
  Filled 2023-08-31: qty 1

## 2023-08-31 MED ORDER — LIDOCAINE HCL (PF) 2 % IJ SOLN
INTRAMUSCULAR | Status: AC
  Filled 2023-08-31: qty 5

## 2023-08-31 MED ORDER — ALBUMIN HUMAN 5 % IV SOLN
INTRAVENOUS | Status: AC
  Filled 2023-08-31: qty 250

## 2023-08-31 MED ORDER — SCOPOLAMINE 1 MG/3DAYS TD PT72
1.0000 | MEDICATED_PATCH | Freq: Once | TRANSDERMAL | Status: AC
  Administered 2023-08-31: 1.5 mg via TRANSDERMAL

## 2023-08-31 MED ORDER — ONDANSETRON HCL 4 MG/2ML IJ SOLN
INTRAMUSCULAR | Status: AC
  Filled 2023-08-31: qty 2

## 2023-08-31 MED ORDER — OXYCODONE HCL 5 MG/5ML PO SOLN: 5.0000 mg | Freq: Once | ORAL | Status: DC | PRN

## 2023-08-31 MED ORDER — PHENYLEPHRINE HCL-NACL 20-0.9 MG/250ML-% IV SOLN
INTRAVENOUS | Status: AC
  Filled 2023-08-31: qty 250

## 2023-08-31 MED ORDER — ATROPINE SULFATE 1 MG/10ML IJ SOSY
PREFILLED_SYRINGE | INTRAMUSCULAR | Status: AC
  Filled 2023-08-31: qty 10

## 2023-08-31 MED ORDER — MAGNESIUM OXIDE -MG SUPPLEMENT 400 (240 MG) MG PO TABS
400.0000 mg | ORAL_TABLET | Freq: Every day | ORAL | Status: DC
  Administered 2023-08-31 – 2023-09-08 (×8): 400 mg via ORAL
  Filled 2023-08-31 (×8): qty 1

## 2023-08-31 MED ORDER — SODIUM CHLORIDE 0.9 % IV SOLN
2.0000 g | INTRAVENOUS | Status: AC
  Administered 2023-08-31: 2 g via INTRAVENOUS
  Filled 2023-08-31: qty 2

## 2023-08-31 MED ORDER — CHLORHEXIDINE GLUCONATE 0.12 % MT SOLN
15.0000 mL | Freq: Once | OROMUCOSAL | Status: AC
Start: 2023-08-31 — End: 2023-08-31
  Administered 2023-08-31: 15 mL via OROMUCOSAL

## 2023-08-31 MED ORDER — LIDOCAINE HCL (CARDIAC) PF 100 MG/5ML IV SOSY
PREFILLED_SYRINGE | INTRAVENOUS | Status: DC | PRN
  Administered 2023-08-31: 60 mg via INTRAVENOUS

## 2023-08-31 MED ORDER — SCOPOLAMINE 1 MG/3DAYS TD PT72
MEDICATED_PATCH | TRANSDERMAL | Status: AC
  Filled 2023-08-31: qty 1

## 2023-08-31 MED ORDER — BISACODYL 5 MG PO TBEC: 20.0000 mg | DELAYED_RELEASE_TABLET | Freq: Once | ORAL | Status: DC

## 2023-08-31 MED ORDER — ROCURONIUM BROMIDE 10 MG/ML (PF) SYRINGE
PREFILLED_SYRINGE | INTRAVENOUS | Status: AC
  Filled 2023-08-31: qty 10

## 2023-08-31 MED ORDER — LACTATED RINGERS IV SOLN: INTRAVENOUS | Status: DC

## 2023-08-31 MED ORDER — ONDANSETRON HCL 4 MG/2ML IJ SOLN: 4.0000 mg | Freq: Four times a day (QID) | INTRAMUSCULAR | Status: DC | PRN

## 2023-08-31 MED ORDER — POLYETHYLENE GLYCOL 3350 17 GM/SCOOP PO POWD: 1.0000 | Freq: Once | ORAL | Status: DC

## 2023-08-31 MED ORDER — METRONIDAZOLE 500 MG PO TABS: 1000.0000 mg | ORAL_TABLET | ORAL | Status: DC

## 2023-08-31 MED ORDER — ACETAMINOPHEN 500 MG PO TABS
1000.0000 mg | ORAL_TABLET | ORAL | Status: AC
  Administered 2023-08-31: 1000 mg via ORAL
  Filled 2023-08-31: qty 2

## 2023-08-31 MED ORDER — LISINOPRIL-HYDROCHLOROTHIAZIDE 10-12.5 MG PO TABS: 1.0000 | ORAL_TABLET | Freq: Every day | ORAL | Status: DC

## 2023-08-31 MED ORDER — CULTURELLE PROBIOTICS PO CHEW: CHEWABLE_TABLET | Freq: Every day | ORAL | Status: DC

## 2023-08-31 MED ORDER — HYDROMORPHONE HCL 1 MG/ML IJ SOLN
INTRAMUSCULAR | Status: DC | PRN
  Administered 2023-08-31: 1 mg via INTRAVENOUS

## 2023-08-31 MED ORDER — KETAMINE HCL 10 MG/ML IJ SOLN
INTRAMUSCULAR | Status: DC | PRN
  Administered 2023-08-31: 30 mg via INTRAVENOUS

## 2023-08-31 MED ORDER — OXYCODONE HCL 5 MG PO TABS
5.0000 mg | ORAL_TABLET | Freq: Once | ORAL | Status: DC | PRN
Start: 2023-08-31 — End: 2023-08-31

## 2023-08-31 MED ORDER — MIDAZOLAM HCL 5 MG/5ML IJ SOLN
INTRAMUSCULAR | Status: DC | PRN
  Administered 2023-08-31: 2 mg via INTRAVENOUS

## 2023-08-31 MED ORDER — STERILE WATER FOR INJECTION IJ SOLN
INTRAMUSCULAR | Status: AC
  Filled 2023-08-31: qty 10

## 2023-08-31 MED ORDER — PROPOFOL 10 MG/ML IV BOLUS
INTRAVENOUS | Status: DC | PRN
  Administered 2023-08-31: 170 mg via INTRAVENOUS

## 2023-08-31 MED ORDER — DEXAMETHASONE SODIUM PHOSPHATE 10 MG/ML IJ SOLN
INTRAMUSCULAR | Status: DC | PRN
  Administered 2023-08-31: 8 mg via INTRAVENOUS

## 2023-08-31 MED ORDER — FENTANYL CITRATE (PF) 100 MCG/2ML IJ SOLN
INTRAMUSCULAR | Status: DC | PRN
  Administered 2023-08-31: 100 ug via INTRAVENOUS

## 2023-08-31 MED ORDER — HYDRALAZINE HCL 20 MG/ML IJ SOLN: 10.0000 mg | INTRAMUSCULAR | Status: DC | PRN

## 2023-08-31 MED ORDER — PROPOFOL 10 MG/ML IV BOLUS
INTRAVENOUS | Status: AC
  Filled 2023-08-31: qty 20

## 2023-08-31 MED ORDER — EPHEDRINE SULFATE (PRESSORS) 50 MG/ML IJ SOLN
INTRAMUSCULAR | Status: DC | PRN
  Administered 2023-08-31: 10 mg via INTRAVENOUS

## 2023-08-31 MED ORDER — SUGAMMADEX SODIUM 200 MG/2ML IV SOLN
INTRAVENOUS | Status: DC | PRN
  Administered 2023-08-31: 200 mg via INTRAVENOUS

## 2023-08-31 MED ORDER — PHENYLEPHRINE HCL-NACL 20-0.9 MG/250ML-% IV SOLN
INTRAVENOUS | Status: DC | PRN
  Administered 2023-08-31: 40 ug/min via INTRAVENOUS

## 2023-08-31 MED ORDER — ONDANSETRON HCL 4 MG/2ML IJ SOLN
INTRAMUSCULAR | Status: DC | PRN
  Administered 2023-08-31: 4 mg via INTRAVENOUS

## 2023-08-31 MED ORDER — STERILE WATER FOR INJECTION IJ SOLN
INTRAMUSCULAR | Status: DC | PRN
  Administered 2023-08-31: 15 mL

## 2023-08-31 MED ORDER — ATROPINE SULFATE 1 MG/ML IV SOLN
INTRAVENOUS | Status: DC | PRN
  Administered 2023-08-31: .5 mg via INTRAVENOUS

## 2023-08-31 MED ORDER — LACTATED RINGERS IR SOLN
Status: DC | PRN
Start: 2023-08-31 — End: 2023-08-31
  Administered 2023-08-31: 1000 mL

## 2023-08-31 MED ORDER — LORATADINE 10 MG PO TABS
10.0000 mg | ORAL_TABLET | Freq: Every day | ORAL | Status: DC
  Administered 2023-08-31 – 2023-09-08 (×8): 10 mg via ORAL
  Filled 2023-08-31 (×8): qty 1

## 2023-08-31 MED ORDER — DEXAMETHASONE SODIUM PHOSPHATE 10 MG/ML IJ SOLN
INTRAMUSCULAR | Status: AC
  Filled 2023-08-31: qty 1

## 2023-08-31 MED ORDER — ALVIMOPAN 12 MG PO CAPS
12.0000 mg | ORAL_CAPSULE | ORAL | Status: AC
  Administered 2023-08-31: 12 mg via ORAL
  Filled 2023-08-31: qty 1

## 2023-08-31 MED ORDER — ROCURONIUM BROMIDE 100 MG/10ML IV SOLN
INTRAVENOUS | Status: DC | PRN
  Administered 2023-08-31 (×2): 20 mg via INTRAVENOUS
  Administered 2023-08-31: 60 mg via INTRAVENOUS

## 2023-08-31 MED ORDER — VITAMIN D 25 MCG (1000 UNIT) PO TABS
2000.0000 [IU] | ORAL_TABLET | Freq: Every day | ORAL | Status: DC
  Administered 2023-08-31 – 2023-09-08 (×8): 2000 [IU] via ORAL
  Filled 2023-08-31 (×8): qty 2

## 2023-08-31 SURGICAL SUPPLY — 109 items
ADAPTER GOLDBERG URETERAL (ADAPTER) IMPLANT
APPLIER CLIP 5 13 M/L LIGAMAX5 (MISCELLANEOUS)
APPLIER CLIP ROT 10 11.4 M/L (STAPLE)
BAG COUNTER SPONGE SURGICOUNT (BAG) IMPLANT
BAG URO CATCHER STRL LF (MISCELLANEOUS) ×1 IMPLANT
BLADE EXTENDED COATED 6.5IN (ELECTRODE) ×1 IMPLANT
CANNULA REDUCER 12-8 DVNC XI (CANNULA) ×1 IMPLANT
CATH URETL OPEN 5X70 (CATHETERS) IMPLANT
CHLORAPREP W/TINT 26 (MISCELLANEOUS) ×1 IMPLANT
CLIP APPLIE 5 13 M/L LIGAMAX5 (MISCELLANEOUS) IMPLANT
CLIP APPLIE ROT 10 11.4 M/L (STAPLE) IMPLANT
CLIP LIGATING HEM O LOK PURPLE (MISCELLANEOUS) IMPLANT
CLIP LIGATING HEMO O LOK GREEN (MISCELLANEOUS) IMPLANT
CLOTH BEACON ORANGE TIMEOUT ST (SAFETY) ×1 IMPLANT
COVER SURGICAL LIGHT HANDLE (MISCELLANEOUS) ×2 IMPLANT
COVER TIP SHEARS 8 DVNC (MISCELLANEOUS) ×1 IMPLANT
DEFOGGER SCOPE WARMER CLEARIFY (MISCELLANEOUS) ×1 IMPLANT
DERMABOND ADVANCED .7 DNX12 (GAUZE/BANDAGES/DRESSINGS) IMPLANT
DEVICE TROCAR PUNCTURE CLOSURE (ENDOMECHANICALS) IMPLANT
DRAIN CHANNEL 19F RND (DRAIN) ×1 IMPLANT
DRAPE ARM DVNC X/XI (DISPOSABLE) ×4 IMPLANT
DRAPE COLUMN DVNC XI (DISPOSABLE) ×1 IMPLANT
DRAPE SURG IRRIG POUCH 19X23 (DRAPES) ×1 IMPLANT
DRIVER NDL LRG 8 DVNC XI (INSTRUMENTS) ×1 IMPLANT
DRIVER NDLE LRG 8 DVNC XI (INSTRUMENTS)
DRSG OPSITE POSTOP 4X10 (GAUZE/BANDAGES/DRESSINGS) IMPLANT
DRSG OPSITE POSTOP 4X6 (GAUZE/BANDAGES/DRESSINGS) IMPLANT
DRSG OPSITE POSTOP 4X8 (GAUZE/BANDAGES/DRESSINGS) IMPLANT
DRSG TEGADERM 2-3/8X2-3/4 SM (GAUZE/BANDAGES/DRESSINGS) ×5 IMPLANT
DRSG TEGADERM 4X4.75 (GAUZE/BANDAGES/DRESSINGS) ×1 IMPLANT
ELECT REM PT RETURN 15FT ADLT (MISCELLANEOUS) ×1 IMPLANT
ENDOLOOP SUT PDS II 0 18 (SUTURE) IMPLANT
EVACUATOR SILICONE 100CC (DRAIN) ×1 IMPLANT
FORCEPS BPLR FENES DVNC XI (FORCEP) IMPLANT
GAUZE SPONGE 2X2 8PLY STRL LF (GAUZE/BANDAGES/DRESSINGS) ×1 IMPLANT
GAUZE SPONGE 4X4 12PLY STRL (GAUZE/BANDAGES/DRESSINGS) IMPLANT
GLOVE BIO SURGEON STRL SZ7.5 (GLOVE) ×3 IMPLANT
GLOVE INDICATOR 8.0 STRL GRN (GLOVE) ×3 IMPLANT
GLOVE SURG LX STRL 7.5 STRW (GLOVE) ×1 IMPLANT
GOWN SRG XL LVL 4 BRTHBL STRL (GOWNS) ×1 IMPLANT
GOWN STRL REUS W/ TWL XL LVL3 (GOWN DISPOSABLE) ×6 IMPLANT
GRASPER SUT TROCAR 14GX15 (MISCELLANEOUS) IMPLANT
GRASPER TIP-UP FEN DVNC XI (INSTRUMENTS) ×1 IMPLANT
GUIDEWIRE ANG ZIPWIRE 038X150 (WIRE) IMPLANT
GUIDEWIRE STR DUAL SENSOR (WIRE) IMPLANT
HOLDER FOLEY CATH W/STRAP (MISCELLANEOUS) ×1 IMPLANT
IRRIG SUCT STRYKERFLOW 2 WTIP (MISCELLANEOUS) ×1
IRRIGATION SUCT STRKRFLW 2 WTP (MISCELLANEOUS) ×1 IMPLANT
KIT PROCEDURE DVNC SI (MISCELLANEOUS) ×1 IMPLANT
KIT TURNOVER KIT A (KITS) IMPLANT
MANIFOLD NEPTUNE II (INSTRUMENTS) ×1 IMPLANT
NDL INSUFFLATION 14GA 120MM (NEEDLE) ×1 IMPLANT
NEEDLE INSUFFLATION 14GA 120MM (NEEDLE) ×1
PACK CARDIOVASCULAR III (CUSTOM PROCEDURE TRAY) ×1 IMPLANT
PACK COLON (CUSTOM PROCEDURE TRAY) ×1 IMPLANT
PACK CYSTO (CUSTOM PROCEDURE TRAY) ×1 IMPLANT
PAD POSITIONING PINK XL (MISCELLANEOUS) ×1 IMPLANT
PENCIL SMOKE EVACUATOR (MISCELLANEOUS) IMPLANT
PROTECTOR NERVE ULNAR (MISCELLANEOUS) ×2 IMPLANT
RELOAD STAPLE 45 3.5 BLU DVNC (STAPLE) IMPLANT
RELOAD STAPLE 45 4.3 GRN DVNC (STAPLE) IMPLANT
RELOAD STAPLE 60 3.5 BLU DVNC (STAPLE) IMPLANT
RELOAD STAPLE 60 4.3 GRN DVNC (STAPLE) IMPLANT
RETRACTOR WND ALEXIS 18 MED (MISCELLANEOUS) IMPLANT
RTRCTR WOUND ALEXIS 18CM MED (MISCELLANEOUS)
SCISSORS LAP 5X35 DISP (ENDOMECHANICALS) IMPLANT
SCISSORS MNPLR CVD DVNC XI (INSTRUMENTS) ×1 IMPLANT
SEAL UNIV 5-12 XI (MISCELLANEOUS) ×4 IMPLANT
SEALER VESSEL EXT DVNC XI (MISCELLANEOUS) ×1 IMPLANT
SLEEVE ADV FIXATION 5X100MM (TROCAR) IMPLANT
SOL ELECTROSURG ANTI STICK (MISCELLANEOUS) ×1
SOLUTION ELECTROSURG ANTI STCK (MISCELLANEOUS) ×1 IMPLANT
SPIKE FLUID TRANSFER (MISCELLANEOUS) ×1 IMPLANT
STAPLER 60 SUREFORM DVNC (STAPLE) IMPLANT
STAPLER ECHELON POWER CIR 29 (STAPLE) IMPLANT
STAPLER ECHELON POWER CIR 31 (STAPLE) IMPLANT
STAPLER RELOAD 3.5X45 BLU DVNC (STAPLE)
STAPLER RELOAD 3.5X60 BLU DVNC (STAPLE)
STAPLER RELOAD 4.3X45 GRN DVNC (STAPLE)
STAPLER RELOAD 4.3X60 GRN DVNC (STAPLE)
STOPCOCK 4 WAY LG BORE MALE ST (IV SETS) ×2 IMPLANT
SURGILUBE 2OZ TUBE FLIPTOP (MISCELLANEOUS) ×1 IMPLANT
SUT ETHILON 2 0 PS N (SUTURE) IMPLANT
SUT MNCRL AB 4-0 PS2 18 (SUTURE) ×1 IMPLANT
SUT PDS AB 1 CT1 27 (SUTURE) IMPLANT
SUT PDS AB 1 TP1 96 (SUTURE) IMPLANT
SUT PROLENE 0 CT 2 (SUTURE) IMPLANT
SUT PROLENE 2 0 KS (SUTURE) ×1 IMPLANT
SUT PROLENE 2 0 SH DA (SUTURE) IMPLANT
SUT SILK 2 0 SH CR/8 (SUTURE) IMPLANT
SUT SILK 2-0 18XBRD TIE 12 (SUTURE) IMPLANT
SUT SILK 3 0 SH CR/8 (SUTURE) ×1 IMPLANT
SUT SILK 3-0 18XBRD TIE 12 (SUTURE) ×1 IMPLANT
SUT V-LOC BARB 180 2/0GR6 GS22 (SUTURE)
SUT VIC AB 3-0 SH 18 (SUTURE) IMPLANT
SUT VIC AB 3-0 SH 27XBRD (SUTURE) IMPLANT
SUT VICRYL 0 UR6 27IN ABS (SUTURE) ×1 IMPLANT
SUTURE V-LC BRB 180 2/0GR6GS22 (SUTURE) IMPLANT
SYR 10ML LL (SYRINGE) ×1 IMPLANT
SYS LAPSCP GELPORT 120MM (MISCELLANEOUS)
SYS WOUND ALEXIS 18CM MED (MISCELLANEOUS) ×1
SYSTEM LAPSCP GELPORT 120MM (MISCELLANEOUS) IMPLANT
SYSTEM WOUND ALEXIS 18CM MED (MISCELLANEOUS) ×1 IMPLANT
TAPE UMBILICAL 1/8 X36 TWILL (MISCELLANEOUS) ×1 IMPLANT
TRAY FOLEY MTR SLVR 16FR STAT (SET/KITS/TRAYS/PACK) ×1 IMPLANT
TROCAR ADV FIXATION 5X100MM (TROCAR) ×1 IMPLANT
TUBING CONNECTING 10 (TUBING) ×4 IMPLANT
TUBING INSUFFLATION 10FT LAP (TUBING) ×1 IMPLANT
TUBING UROLOGY SET (TUBING) IMPLANT

## 2023-08-31 NOTE — Transfer of Care (Signed)
Immediate Anesthesia Transfer of Care Note  Patient: Natasha Wilkinson  Procedure(s) Performed: XI ROBOTIC ASSISTED DIAGNOSTIC LAPAROSCOPY WITH EXTENSIVE LYSIS OF ADHESIONS DIAGNOSTIC FLEXIBLE SIGMOIDOSCOPY INCARCERATED INCISIONAL HERNIA REPAIR CYSTOSCOPY with FIREFLY INJECTION  Patient Location: PACU  Anesthesia Type:General  Level of Consciousness: drowsy  Airway & Oxygen Therapy: Patient Spontanous Breathing and Patient connected to face mask oxygen  Post-op Assessment: Report given to RN and Post -op Vital signs reviewed and stable  Post vital signs: Reviewed and stable  Last Vitals:  Vitals Value Taken Time  BP 124/74 08/31/23 1217  Temp    Pulse 78 08/31/23 1219  Resp 13 08/31/23 1219  SpO2 100 % 08/31/23 1219  Vitals shown include unfiled device data.  Last Pain:  Vitals:   08/31/23 0706  TempSrc:   PainSc: 0-No pain         Complications: No notable events documented.

## 2023-08-31 NOTE — Op Note (Addendum)
PATIENT: Natasha Wilkinson  63 y.o. female  Patient Care Team: Noberto Retort, MD as PCP - General (Family Medicine) Donnetta Hail, MD as Consulting Physician (Rheumatology)  PREOP DIAGNOSIS: COMPLICATED DIVERTICULITIS, POSSIBLE COLOVESICAL FISTULA  POSTOP DIAGNOSIS: COMPLICATED DIVERTICULITIS, POSSIBLE COLOVESICAL FISTULA  PROCEDURE:  Robotic assisted repair of incarcerated incisional hernia 5 x 5 cm Robotic assisted lysis of adhesions x 120 minutes, extensive Diagnostic flexible sigmoidoscopy Bilateral transversus abdominus plane (TAP) blocks  Planned sigmoidectomy/low anterior resection ultimately aborted  SURGEON: Stephanie Coup. Cliffton Asters, MD  ASSISTANT: Karie Soda, MD  An experienced assistant was required given the complexity of this procedure and the standard of surgical care. My assistant helped with exposure through counter tension, suctioning, ligation and retraction to better visualize the surgical field. My assistant expedited sewing during the case by following my sutures. Wherever I use the term "we" in the report, my assistant actively helped me with that portion of the procedure.   ANESTHESIA: General endotracheal  EBL: 50 mL Total I/O In: 1000 [I.V.:1000] Out: 250 [Urine:200; Blood:50]  DRAINS: 19 Fr round blake drain left draining pelvis  SPECIMEN: None  COUNTS: Sponge, needle and instrument counts were reported correct x2  FINDINGS: Bowel containing incarcerated incisional hernia which also measured 5 x 5 cm and was Swiss cheese type defect.  Adhesions related prior surgery but additionally, dense pelvic adhesions from prior surgery for endometriosis.  Ultimately, the mid sigmoid colon was found to be densely adherent over the left iliac artery with the left ureter immediately medial and inferior.  We were able to straighten out her sigmoid colon by releasing some of her pelvic adhesions.  Endoscopically, there is no intraluminal mass or other concerning  findings.  There is also no evident stricture or narrowing.  Given these constellation of findings, we opted to not attempt resection of the sigmoid colon given the increased risk for injury to both the iliac artery and left ureter which are intimately associated.  Incisional hernia was primarily closed without mesh given the nature of the procedure and preoperative discussions.  NARRATIVE: Informed consent was verified. The patient was taken to the operating room, placed supine on the operating table and SCD's were applied. General endotracheal anesthesia was induced without difficulty. She was then positioned in the lithotomy position with Allen stirrups.  Pressure points were evaluated and padded.  A foley catheter was then placed by nursing under sterile conditions. Hair on the abdomen was clipped.  She was secured to the operating table. Dr. Marlou Porch with [facility] scrubbed for his portion of the procedure.  Please refer to his notes for details.  There was no evident fistula to the bladder from the cystoscopy approach. The abdomen was then prepped and draped in the standard sterile fashion. Surgical timeout was called indicating the correct patient, procedure, positioning and need for preoperative antibiotics.   An OG tube was placed by anesthesia and confirmed to be to suction.  At Palmer's point, a stab incision was created and the Veress needle was introduced into the peritoneal cavity on the first attempt.  Intraperitoneal location was confirmed by the aspiration and saline drop test.  Pneumoperitoneum was established to a maximum pressure of 15 mmHg using CO2.  Following this, the abdomen was marked for planned trocar sites.  Just to the right and cephalad to the umbilicus, an 8 mm incision was created and an 8 mm blunt tipped robotic trocar was cautiously placed into the peritoneal cavity.  The laparoscope was inserted and  demonstrated no evidence of trocar site nor Veress needle site  complications.  The Veress needle was removed.  Bilateral transversus abdominis plane blocks were then created using a dilute mixture of Exparel with Marcaine.  3 additional 8 mm robotic trochars were placed under direct visualization roughly in a line extending from the right ASIS towards the left upper quadrant. The bladder was inspected and noted to be at/below the pubic symphysis.  An additional 5 mm assist port was placed in the right lateral abdomen under direct visualization.  The abdomen was surveyed and there was adhesions containing both small bowel and omentum to the low midline abdominal wall consistent with prior surgery.  There is also a incarcerated bowel containing incisional hernia. She was positioned in Trendelenburg with the left side tilted slightly up. The robot was then docked and I went to the console.  We began with adhesiolysis. Adhesions consisting of small bowel and omentum were carefully taken down sharply from the abdominal wall.  There is an incarcerated bowel containing incisional hernia.  We are able to have pressure applied from the abdominal side and developed our plane dissecting the bowel free of this.  In doing so, were able to free the bowel from the hernia. In doing so, were able to visualize the pelvis.  There were dense adhesions within the pelvis related to her prior surgery for endometriosis.  We spent approximately 2 hours with adhesiolysis carefully freeing both the terminal ileum, cecum, and portions of the sigmoid colon out of the pelvis.  I did go below intermittently throughout this process and passed a 25 mm EEA sizer under direct visualization into the rectum.  This aided in visualization of what was actually rectum proper.  The vaginal cuff is also deep within the pelvis.  There are dense, tenacious type adhesions between the sigmoid colon and what is likely the urinary bladder.  We therefore began by mobilizing the descending colon.  The Kiya Eno line of Toldt is  incised and the associated descending mesocolon is mobilized medially.  We are able to identify the ureter cephalad to the dense pelvic adhesions with the aid of near-infrared light and ICG in place.  We then approached the diseased/fibrotic segment of sigmoid colon.  We were able to partially straighten this segment out as there is dense scar tissue this location.  In mobilizing the colon medially, it became clear that the left iliac artery was intimately associated with the fibrosis/scarring of her sigmoid colon likely related to prior surgery.  The left ureter was also able to be seen running medially and deep to this plane.  We felt from this approach, there would certainly be an increased risk for injury to the iliac or left ureter.  We altered our approach and began looking at things from a medial to lateral type view.  Peritoneum overlying the rectosigmoid colon was incised.  We are able to get into a plane underneath the IMA artery in an effort to lift the mesocolon from the underlying retroperitoneum.  With this approach, it was also clear that there is dense scarring overlying the iliac and the retroperitoneum was tented upward.  We felt that further dissection at this location also significantly increase the risk for injury to the iliac artery or left ureter.  The colon at the level of descending colon is gently clamped using a bowel grasper.  I then went below to pass the flexible sigmoidoscope.  Direct visualization the flexible sigmoidoscope was inserted into the anal canal  and into the distal rectum.  The mucosa is healthy and normal in appearance.  The scope was then advanced.  There is no significant narrowing or stricturing noted at this time at the level of the rectosigmoid colon or easily able to get into the sigmoid colon without any significant difficulty.  The mucosa is normal in appearance.  I do not see any evident masses or bottleneck type stricturing at this location.  Given the  constellation of findings, I do not see any clear evidence that she had actually had a diverticular type stricture and suspect this was all related to scarring from prior surgery.  At the pelvis filled with sterile saline, there are no evident air leaks either.  There were no mucosal defects visible from my endoscopic approach either.  The flexible sigmoidoscope was carefully withdrawn.  I went back to the console.  The pelvis was inspected.  Hemostasis is confirmed.  The ileum and cecum were carefully inspected.  There are no evident serosal injuries.  Hemostasis is noted.  We opted to terminate the attempt at sigmoidectomy given these constellation of findings and additionally working in a somewhat resource limited environment without vascular surgery readily available.  The robot was then undocked and I scrubbed back in.  Robotic trocars were removed under direct visualization and noted to be hemostatic.  A 19 French round Blake drain was placed through one of our 8 mm port sites and left drain in the pelvis.  This was secured to the skin with a 2-0 nylon stitch.  Attention was directed to repairing the incisional hernia.  A skin incision was made just inferior to the umbilicus and the subcutaneous tissues divided.  The hernia sac is readily answered.  There are at least 2 or 3 defects in the fascia this location with a Swiss cheese type defect.  Total defect measures approximately 5 x 5 cm in size.  The hernia sac is dissected free of the fascia.  Were able to expose the fascia circumferentially.  Given the nature of the planned primary procedure, we did opt to proceed with a primary type repair as this was in line with our discussions preoperatively.  The fascial defect is able to be closed using 2 running #1 PDS sutures.  The defect is then palpated and noted to be completely obliterated.  The wound is irrigated.  Hemostasis is verified.  Additional anesthetic was infiltrated at the hernia repair site.   Sponge, needle, and instrument counts were reported correct x2. 4-0 Monocryl subcuticular suture was used to close the skin of all incision sites.  Dermabond was placed over all incisions.   She was then taken out of lithotomy, awakened from anesthesia, extubated, and transferred to a stretcher for transport to PACU in satisfactory condition having tolerated the procedure well.

## 2023-08-31 NOTE — Anesthesia Postprocedure Evaluation (Signed)
Anesthesia Post Note  Patient: Natasha Wilkinson  Procedure(s) Performed: XI ROBOTIC ASSISTED DIAGNOSTIC LAPAROSCOPY WITH EXTENSIVE LYSIS OF ADHESIONS DIAGNOSTIC FLEXIBLE SIGMOIDOSCOPY INCARCERATED INCISIONAL HERNIA REPAIR CYSTOSCOPY with FIREFLY INJECTION     Patient location during evaluation: PACU Anesthesia Type: General Level of consciousness: awake and alert Pain management: pain level controlled Vital Signs Assessment: post-procedure vital signs reviewed and stable Respiratory status: spontaneous breathing, nonlabored ventilation, respiratory function stable and patient connected to nasal cannula oxygen Cardiovascular status: blood pressure returned to baseline and stable Postop Assessment: no apparent nausea or vomiting Anesthetic complications: no  No notable events documented.  Last Vitals:  Vitals:   08/31/23 1400 08/31/23 1524  BP: 118/67 110/73  Pulse: 77 67  Resp: 15 18  Temp:  36.6 C  SpO2: 97% 99%    Last Pain:  Vitals:   08/31/23 1524  TempSrc: Oral  PainSc:                  Kennieth Rad

## 2023-08-31 NOTE — Anesthesia Preprocedure Evaluation (Signed)
Anesthesia Evaluation  Patient identified by MRN, date of birth, ID band Patient awake    Reviewed: Allergy & Precautions, NPO status , Patient's Chart, lab work & pertinent test results  Airway Mallampati: II  TM Distance: >3 FB Neck ROM: Full    Dental   Pulmonary sleep apnea and Continuous Positive Airway Pressure Ventilation    Pulmonary exam normal        Cardiovascular hypertension, Pt. on medications and Pt. on home beta blockers  Rhythm:Regular Rate:Normal     Neuro/Psych negative neurological ROS     GI/Hepatic Neg liver ROS,,,diverticulitis   Endo/Other  negative endocrine ROS    Renal/GU negative Renal ROS     Musculoskeletal   Abdominal   Peds  Hematology negative hematology ROS (+)   Anesthesia Other Findings   Reproductive/Obstetrics                             Anesthesia Physical Anesthesia Plan  ASA: 2  Anesthesia Plan: General   Post-op Pain Management: Tylenol PO (pre-op)*, Ketamine IV* and Lidocaine infusion*   Induction: Intravenous  PONV Risk Score and Plan: 4 or greater and Dexamethasone, Ondansetron, Midazolam, Treatment may vary due to age or medical condition and Scopolamine patch - Pre-op  Airway Management Planned: Oral ETT  Additional Equipment: None  Intra-op Plan:   Post-operative Plan: Extubation in OR  Informed Consent: I have reviewed the patients History and Physical, chart, labs and discussed the procedure including the risks, benefits and alternatives for the proposed anesthesia with the patient or authorized representative who has indicated his/her understanding and acceptance.     Dental advisory given  Plan Discussed with: CRNA  Anesthesia Plan Comments:        Anesthesia Quick Evaluation

## 2023-08-31 NOTE — Progress Notes (Signed)
   08/31/23 2229  BiPAP/CPAP/SIPAP  $ Non-Invasive Home Ventilator  Initial  BiPAP/CPAP/SIPAP Pt Type Adult  BiPAP/CPAP/SIPAP DREAMSTATIOND  Mask Type Nasal mask (MASK FROM HOME)  Respiratory Rate 16 breaths/min  FiO2 (%) 21 %  Patient Home Equipment No (only her mask and tubing from home)  Auto Titrate Yes (automode, min6cm h2o per pt request/comfort, max20cm h2o)  CPAP/SIPAP surface wiped down Yes  BiPAP/CPAP /SiPAP Vitals  Pulse Rate 65  Resp 16  SpO2 96 %

## 2023-08-31 NOTE — H&P (Signed)
CC: Here today for surgery  HPI: Natasha Wilkinson is an 63 y.o. female with history of HTN, kidney stones, diverticulitis, whom is seen in the office today as a referral by Dr. Tiburcio Pea for evaluation of incisional hernia.   CT A/P 03/22/23 IMPRESSION: 1. Fat containing ventral hernia to the left of the midline surgical incision with findings of possible strangulation/incarceration. Clinical correlation is recommended. 2. Sigmoid diverticulosis with muscular hypertrophy. There is narrowing of the lumen of the sigmoid colon likely related to strictures with resulting obstruction. Further evaluation with colonoscopy is recommended. 3. Aortic Atherosclerosis (ICD10-I70.0).  She does have a known history diverticulitis. She has previously seen Dr. Inetta Fermo at [facility]. She has a note dated 09/22/2019 where she had discussed possible surgery. She had noted frequent UTIs that time and there was question brought up of a possible occult colovesical fistula in the setting of diverticulitis.  Reports that she has noted the hernia at the site where she had her uterus removed and has been there for some time. She has had some left-sided abdominal discomfort and crampy type pains that does appear to be well lateral to her actual incisional hernia. She denies any evident history of the hernia becoming really firm hard or swollen. She has noticed some activity related discomforts when picking up grandchildren.  She does not have a known history of some degree of constipation. She has been told in the past to take fiber with Dr. Drue Dun. She does also take magnesium when she feels really constipated. She is not taking anything proactively like MiraLAX.  Reports that she had a colonoscopy at [facility] somewhere back around 2021 and believes she was told to have a repeat in 2 to 3 years. This has not happened. She has previously had colonoscopies through [facility] with Dr. Matthias Hughs. We do not have records of  either of these prior scopes. She wishes to return to [facility] for future colonoscopies.  With regards to diverticulitis, she reports at least 2 prior bouts where she has had to take antibiotics with improvement. She also has a known history of recurrent urinary tract infections and there was some discussion that this may have been related to a possible occult colovesical fistula when she is seeing colorectal surgery at [facility].  INTERVAL HX Returns today for follow-up. She has been doing well. She did obtain a colonoscopy with Dr. Ewing Schlein.  Colonoscopy 06/22/2023: - Hemorrhoids - Diverticulosis in the sigmoid - Congested mucosa in the sigmoid - Stricture in the sigmoid - Scar in the descending colon. "Probably from previous ischemic colitis and present in a dilated segment just proximal to the strictured sigmoid." - Examination otherwise normal - No specimens.  Procedure for 06/07/2013 with Dr. Mia Creek -  1. Diagnostic laparoscopy, Total abdominal hysterectomy, bilateral salpingo-oophorectomy, extensive lysis of rectosigmoid, small intestinal pelvic adhesions, bilateral ureterolysis, appendectomy and cystoscopy with placement of bilateral double-J ureteral stents and appendectomy.   IMPRESSION: Symptomatic endometriosis with history of abnormal uterine bleeding.   Ureteral stents are subsequently removed in his office 06/25/2013.  PMH: HTN, kidney stones, diverticulitis,   PSH: Abdominal hysterectomy with extraction via periumbilical incision - Dr. Blima Rich. Denies any other abdominal or pelvic surgical history.  FHx: Denies any known family history of colorectal, breast, endometrial or ovarian cancer  Social Hx: Denies use of tobacco/EtOH/illicit drug. She is here today with her husband.   She denies any changes in health or health history since we met in the office.  No new medications/allergies. She states she is ready for surgery today. Tolerated bowel prep with  satisfactory result.  Past Medical History:  Diagnosis Date   Diverticulitis    Fistula    Hemorrhoid    Hypercholesteremia    Hypertension    Lichenoid dermatitis    Obesity    PONV (postoperative nausea and vomiting)    Seasonal allergies    Sleep apnea    cpap    Past Surgical History:  Procedure Laterality Date   ABDOMINAL HYSTERECTOMY  2015   CHOLECYSTECTOMY  2005   COLONOSCOPY  2016   incomplete   NASAL SINUS SURGERY  1991    Family History  Problem Relation Age of Onset   Colon polyps Father    Prostate cancer Father    Aortic aneurysm Mother    Hypertension Mother    Heart disease Mother    Colon polyps Sister     Social:  reports that she has never smoked. She has never used smokeless tobacco. She reports that she does not drink alcohol and does not use drugs.  Allergies:  Allergies  Allergen Reactions   Peanut-Containing Drug Products Anaphylaxis, Itching and Swelling    *Walnuts and Peacan's-not peanuts* Swelling of throat    Ciprofloxacin     rash   Hydrocodone-Acetaminophen    Losartan Rash   Norvasc [Amlodipine Besylate] Rash   Sulfa Antibiotics Rash    Medications: I have reviewed the patient's current medications.  No results found for this or any previous visit (from the past 48 hours).  No results found.   PE Blood pressure 135/78, pulse 81, temperature 98.6 F (37 C), temperature source Oral, resp. rate 16, height 5' 3.75" (1.619 m), weight 95 kg, SpO2 94%. Constitutional: NAD; conversant Eyes: Moist conjunctiva; no lid lag; anicteric Lungs: Normal respiratory effort CV: RRR GI: Abd soft, NT/ND Psychiatric: Appropriate affect  No results found for this or any previous visit (from the past 48 hours).  No results found.  A/P: Natasha Wilkinson is an 63 y.o. female hx of HTN, kidney stones, diverticulitis, here for evaluation of both incisional hernia and but I suspect to be a diverticular stricture. Prior pelvic endometriosis  ([facility] - lap--open hysterectomy 2014)  -Reviewing her CT scan, she does have fairly dilated distal descending colon proximal to her sigmoid colon which is also in close apposition to her urinary bladder. It is certainly possible she could have an occult colovesical fistula with a diverticular type stricture.  -We sat down with her and her husband and showed him CT images and what we were seeing. Given the appearance on imaging, I do think it would be reasonable to consider proceeding with sigmoidectomy, ideally minimally invasive if at all possible and we would plan for an extraction through her incisional hernia. We would then plan to close the incisional hernia primarily accepting a higher risk for recurrence but avoiding the placement of mesh given the potential contamination.  -Colonoscopy completed Dr. Ewing Schlein as noted above. Diverticular type stricture which was traversable  -Continue MiraLAX daily  -The anatomy and physiology of the GI tract was reviewed with the patient. The pathophysiology of diverticulitis and possible colovesical fistula was discussed as well with associated pictures.  -We have discussed various different treatment options going forward including surgery (the most definitive) to address this - Robotic assisted low anterior resection with possible takedown of colovesical fistula, repair of incisional hernia, possible lysis of adhesions, flexible sigmoidoscopy, [facility]-cystoscopy/ureteral ICG  -The planned  procedure, material risks (including, but not limited to, pain, bleeding, infection, scarring, need for blood transfusion, damage to surrounding structures- blood vessels/nerves/viscus/organs, damage to ureter, urine leak, leak from anastomosis, need for additional procedures, low anterior resection syndrome (LARS) = increased fecal urgency and/or frequency, scenarios where a stoma may be necessary and where it may be permanent, worsening of pre-existing medical  conditions, chronic diarrhea, constipation secondary to narcotic use, hernia, recurrence, pneumonia, heart attack, stroke, death) benefits and alternatives to surgery were discussed at length. The patient's questions were answered to her and her husband's satisfaction, they voiced understanding and elected to proceed with surgery. Additionally, we discussed typical postoperative expectations and the recovery process.   Marin Olp, MD [facility], [facility]

## 2023-08-31 NOTE — Anesthesia Procedure Notes (Signed)
Procedure Name: Intubation Date/Time: 08/31/2023 8:56 AM  Performed by: Jamelle Rushing, CRNAPre-anesthesia Checklist: Patient identified, Emergency Drugs available, Suction available, Patient being monitored and Timeout performed Patient Re-evaluated:Patient Re-evaluated prior to induction Oxygen Delivery Method: Circle system utilized Preoxygenation: Pre-oxygenation with 100% oxygen Induction Type: IV induction Ventilation: Mask ventilation without difficulty Laryngoscope Size: Mac and 3 Grade View: Grade III Tube type: Oral Tube size: 7.0 mm Number of attempts: 1 Airway Equipment and Method: Stylet Placement Confirmation: ETT inserted through vocal cords under direct vision, positive ETCO2 and breath sounds checked- equal and bilateral Secured at: 22 cm Tube secured with: Tape Dental Injury: Injury to lip

## 2023-09-01 ENCOUNTER — Encounter (HOSPITAL_COMMUNITY): Payer: Self-pay | Admitting: Surgery

## 2023-09-01 LAB — CBC
HCT: 29.9 % — ABNORMAL LOW (ref 36.0–46.0)
Hemoglobin: 9.7 g/dL — ABNORMAL LOW (ref 12.0–15.0)
MCH: 30.3 pg (ref 26.0–34.0)
MCHC: 32.4 g/dL (ref 30.0–36.0)
MCV: 93.4 fL (ref 80.0–100.0)
Platelets: 207 10*3/uL (ref 150–400)
RBC: 3.2 MIL/uL — ABNORMAL LOW (ref 3.87–5.11)
RDW: 13.5 % (ref 11.5–15.5)
WBC: 7.1 10*3/uL (ref 4.0–10.5)
nRBC: 0 % (ref 0.0–0.2)

## 2023-09-01 LAB — BASIC METABOLIC PANEL
Anion gap: 7 (ref 5–15)
BUN: 9 mg/dL (ref 8–23)
CO2: 25 mmol/L (ref 22–32)
Calcium: 8.5 mg/dL — ABNORMAL LOW (ref 8.9–10.3)
Chloride: 104 mmol/L (ref 98–111)
Creatinine, Ser: 0.73 mg/dL (ref 0.44–1.00)
GFR, Estimated: >60 mL/min (ref >60)
Glucose, Bld: 123 mg/dL — ABNORMAL HIGH (ref 70–99)
Potassium: 3.9 mmol/L (ref 3.5–5.1)
Sodium: 136 mmol/L (ref 135–145)

## 2023-09-01 NOTE — Progress Notes (Signed)
   09/01/23 0948  TOC Brief Assessment  Insurance and Status Reviewed  Patient has primary care physician Yes  Home environment has been reviewed home with spouse  Prior level of function: independent  Prior/Current Home Services No current home services  Social Drivers of Health Review SDOH reviewed no interventions necessary  Readmission risk has been reviewed Yes  Transition of care needs no transition of care needs at this time

## 2023-09-01 NOTE — Progress Notes (Signed)
   09/01/23 2032  BiPAP/CPAP/SIPAP  BiPAP/CPAP/SIPAP Pt Type Adult (pt prefers / comfortable with self-placement)  BiPAP/CPAP/SIPAP DREAMSTATIOND  Mask Type Nasal mask (from home)  FiO2 (%) 21 %  Patient Home Equipment No (only her mask and tubing from home)  Auto Titrate Yes (pt comfortable with previous night's settings, automode min6cm per pt request, max20cm h2o)  CPAP/SIPAP surface wiped down Yes

## 2023-09-01 NOTE — Plan of Care (Signed)
  Problem: Activity: Goal: Ability to tolerate increased activity will improve Outcome: Progressing   Problem: Activity: Goal: Risk for activity intolerance will decrease Outcome: Progressing   Problem: Coping: Goal: Level of anxiety will decrease Outcome: Progressing   Problem: Pain Managment: Goal: General experience of comfort will improve and/or be controlled Outcome: Progressing   Problem: Safety: Goal: Ability to remain free from injury will improve Outcome: Progressing

## 2023-09-01 NOTE — Op Note (Signed)
Preoperative diagnosis:  Pelvic Abscess   Diverticulitis Postoperative diagnosis:  Same   Procedure: Cystoscopy Instillation of ureteral firefly constrast   Surgeon: Crist Fat, MD   Anesthesia: General   Complications: None   Intraoperative findings: Normal appearing bladder with no evidence of a fistula.  EBL: Minimal   Specimens: None   Indication:  Natasha Wilkinson  is a 63 y.o.  patient with diverticular abscess and possible colovesical fistula.  Dr. Cliffton Asters requested cystoscopy and instillation of firefly contrast to help facilitate the dissection of the sigmoid colon.  After reviewing the management options for treatment, he elected to proceed with the above surgical procedure(s). We have discussed the potential benefits and risks of the procedure, side effects of the proposed treatment, the likelihood of the patient achieving the goals of the procedure, and any potential problems that might occur during the procedure or recuperation. Informed consent has been obtained.   Description of procedure:   The patient was taken to the operating room and general anesthesia was induced.  The patient was placed in the dorsal lithotomy position, prepped and draped in the usual sterile fashion, and preoperative antibiotics were administered. A preoperative time-out was performed.    A 21 French 30 degree cystoscope was gently passed through the patient's urethra into the bladder.  The bladder was subsequently emptied and then filled slowly up performing a 360 degrees cystoscopic evaluation.  This demonstrated orthotopic ureteral orifices, normal bladder mucosa with an area of heaped mucosa and bullous edema in the dome -  evidence of colovesical fistula without mucosal abnormality.   I then advanced a 5 [place] open-ended ureteral catheter into the patient's left ureteral orifice and  I then advanced the catheter up into the proximal ureter and then slowly pulled back and injected  7.4ml of the firefly contrast.  Subsequently turned my attention to the patient's right ureteral orifice and performed a similar task.   I then  placed a 16 [place] Foley.    The surgery was then turned over to Dr. Cliffton Asters for facilitation of the remainder of the case.

## 2023-09-01 NOTE — Progress Notes (Signed)
Mobility Specialist - Progress Note   09/01/23 0914  Mobility  Activity Ambulated with assistance in hallway  Level of Assistance Modified independent, requires aide device or extra time  Assistive Device Front wheel walker  Distance Ambulated (ft) 500 ft  Activity Response Tolerated well  Mobility Referral Yes  Mobility visit 1 Mobility  Mobility Specialist Start Time (ACUTE ONLY) [id number]  Mobility Specialist Stop Time (ACUTE ONLY) 0913  Mobility Specialist Time Calculation (min) (ACUTE ONLY) 15 min   Pt received in bed and agreeable to mobility. No complaints during session. Pt to recliner after session with all needs met.    [facility]

## 2023-09-01 NOTE — Plan of Care (Signed)
Problem: Clinical Measurements: Goal: Ability to maintain clinical measurements within normal limits will improve Outcome: Progressing Goal: Will remain free from infection Outcome: Progressing

## 2023-09-01 NOTE — Progress Notes (Signed)
  Subjective No acute events. Feeling well. Up walking halls this morning. No n/v. Tolerating liquids without trouble. No BM as of yet. Her daughter is at bedside.  Objective: Vital signs in last 24 hours: Temp:  [97.6 F (36.4 C)-98.2 F (36.8 C)] 97.9 F (36.6 C) (01/30 0559) Pulse Rate:  [57-79] 57 (01/30 0559) Resp:  [13-21] 16 (01/30 0131) BP: (87-124)/(54-76) 95/76 (01/30 0559) SpO2:  [95 %-100 %] 96 % (01/30 0559) FiO2 (%):  [21 %] 21 % (01/29 2229) Last BM Date : 08/30/23  Intake/Output from previous day: 01/29 0701 - 01/30 0700 In: 3557 [P.O.:700; I.V.:2507; IV Piggyback:350] Out: 2095 [Urine:1850; Drains:195; Blood:50] Intake/Output this shift: No intake/output data recorded.  Gen: NAD, comfortable CV: RRR Pulm: Normal work of breathing Abd: Soft, NT/ND; JP drain thin serosanguinous fluid. Incisions without drainage. No palpable hernias. Ext: SCDs in place  Lab Results: CBC  Recent Labs    09/01/23 0505  WBC 7.1  HGB 9.7*  HCT 29.9*  PLT 207   BMET Recent Labs    09/01/23 0505  NA 136  K 3.9  CL 104  CO2 25  GLUCOSE 123*  BUN 9  CREATININE 0.73  CALCIUM 8.5*   PT/INR No results for input(s): "LABPROT", "INR" in the last 72 hours. ABG No results for input(s): "PHART", "HCO3" in the last 72 hours.  Invalid input(s): "PCO2", "PO2"  Studies/Results:  Anti-infectives: Anti-infectives (From admission, onward)    Start     Dose/Rate Route Frequency Ordered Stop   08/31/23 1400  neomycin (MYCIFRADIN) tablet 1,000 mg  Status:  Discontinued       Placed in "And" Linked Group   1,000 mg Oral 3 times per day 08/31/23 0643 08/31/23 0645   08/31/23 1400  metroNIDAZOLE (FLAGYL) tablet 1,000 mg  Status:  Discontinued       Placed in "And" Linked Group   1,000 mg Oral 3 times per day 08/31/23 9562 08/31/23 0645   08/31/23 0645  cefoTEtan (CEFOTAN) 2 g in sodium chloride 0.9 % 100 mL IVPB        2 g 200 mL/hr over 30 Minutes Intravenous On call to O.R.  08/31/23 0643 08/31/23 1510        Assessment/Plan: Patient Active Problem List   Diagnosis Date Noted   S/P hernia repair 08/31/2023   Lung nodule 11/20/2021   Seasonal allergies    Hypertension    Hypercholesteremia    Obesity    Lichenoid dermatitis    Diverticulitis    Hemorrhoid    s/p Procedure(s): XI ROBOTIC ASSISTED DIAGNOSTIC LAPAROSCOPY WITH EXTENSIVE LYSIS OF ADHESIONS DIAGNOSTIC FLEXIBLE SIGMOIDOSCOPY INCARCERATED INCISIONAL HERNIA REPAIR CYSTOSCOPY with FIREFLY INJECTION 08/31/2023  - Doing quite well - we spent time yesterday going over everything with her, findings, and plans moving forward.  - Adv to soft diet today, D/C IVF - D/C Foley - Ambulate 5x/day as able - Abdominal binder ordered for comfort - Cont JP to bulb suction today - PPX: SQH, SCDs   LOS: 1 day   I spent a total of 35 minutes in both face-to-face and non-face-to-face activities, excluding procedures performed, for this visit on the date of this encounter.  Marin Olp, MD [facility]

## 2023-09-02 LAB — CBC
HCT: 31.7 % — ABNORMAL LOW (ref 36.0–46.0)
Hemoglobin: 10.4 g/dL — ABNORMAL LOW (ref 12.0–15.0)
MCH: 30.4 pg (ref 26.0–34.0)
MCHC: 32.8 g/dL (ref 30.0–36.0)
MCV: 92.7 fL (ref 80.0–100.0)
Platelets: 221 10*3/uL (ref 150–400)
RBC: 3.42 MIL/uL — ABNORMAL LOW (ref 3.87–5.11)
RDW: 14.2 % (ref 11.5–15.5)
WBC: 7.5 10*3/uL (ref 4.0–10.5)
nRBC: 0 % (ref 0.0–0.2)

## 2023-09-02 LAB — BASIC METABOLIC PANEL
Anion gap: 6 (ref 5–15)
BUN: 14 mg/dL (ref 8–23)
CO2: 25 mmol/L (ref 22–32)
Calcium: 8.7 mg/dL — ABNORMAL LOW (ref 8.9–10.3)
Chloride: 103 mmol/L (ref 98–111)
Creatinine, Ser: 0.59 mg/dL (ref 0.44–1.00)
GFR, Estimated: >60 mL/min (ref >60)
Glucose, Bld: 103 mg/dL — ABNORMAL HIGH (ref 70–99)
Potassium: 3.6 mmol/L (ref 3.5–5.1)
Sodium: 134 mmol/L — ABNORMAL LOW (ref 135–145)

## 2023-09-02 MED ORDER — TRAMADOL HCL 50 MG PO TABS: 50.0000 mg | ORAL_TABLET | Freq: Four times a day (QID) | ORAL | 0 refills | Status: AC | PRN

## 2023-09-02 MED ORDER — POLYETHYLENE GLYCOL 3350 17 G PO PACK
17.0000 g | PACK | Freq: Two times a day (BID) | ORAL | Status: DC
  Administered 2023-09-02 – 2023-09-06 (×7): 17 g via ORAL
  Filled 2023-09-02 (×7): qty 1

## 2023-09-02 NOTE — Plan of Care (Signed)
  Problem: Bowel/Gastric: Goal: Gastrointestinal status for postoperative course will improve Outcome: Progressing   Problem: Clinical Measurements: Goal: Will remain free from infection Outcome: Progressing

## 2023-09-02 NOTE — Progress Notes (Signed)
  Subjective No acute events. Feeling well. No n/v. Tolerating solid food now without trouble. No BM as of yet. No flatus  Objective: Vital signs in last 24 hours: Temp:  [97.3 F (36.3 C)-98.1 F (36.7 C)] 97.9 F (36.6 C) (01/31 0604) Pulse Rate:  [58-73] 69 (01/30 2102) Resp:  [15-17] 17 (01/31 0604) BP: (105-135)/(70-78) 108/78 (01/31 0604) SpO2:  [97 %-98 %] 97 % (01/31 0604) FiO2 (%):  [21 %] 21 % (01/30 2032) Last BM Date : 08/30/23  Intake/Output from previous day: 01/30 0701 - 01/31 0700 In: 1510 [P.O.:1510] Out: 1670 [Urine:1500; Drains:170] Intake/Output this shift: No intake/output data recorded.  Gen: NAD, comfortable CV: RRR Pulm: Normal work of breathing Abd: Soft, NT/ND; JP drain thin serosanguinous fluid. Incisions without drainage. No palpable hernias. Ext: SCDs in place  Lab Results: CBC  Recent Labs    09/01/23 0505 09/02/23 0432  WBC 7.1 7.5  HGB 9.7* 10.4*  HCT 29.9* 31.7*  PLT 207 221   BMET Recent Labs    09/01/23 0505 09/02/23 0432  NA 136 134*  K 3.9 3.6  CL 104 103  CO2 25 25  GLUCOSE 123* 103*  BUN 9 14  CREATININE 0.73 0.59  CALCIUM 8.5* 8.7*   PT/INR No results for input(s): "LABPROT", "INR" in the last 72 hours. ABG No results for input(s): "PHART", "HCO3" in the last 72 hours.  Invalid input(s): "PCO2", "PO2"  Studies/Results:  Anti-infectives: Anti-infectives (From admission, onward)    Start     Dose/Rate Route Frequency Ordered Stop   08/31/23 1400  neomycin (MYCIFRADIN) tablet 1,000 mg  Status:  Discontinued       Placed in "And" Linked Group   1,000 mg Oral 3 times per day 08/31/23 0643 08/31/23 0645   08/31/23 1400  metroNIDAZOLE (FLAGYL) tablet 1,000 mg  Status:  Discontinued       Placed in "And" Linked Group   1,000 mg Oral 3 times per day 08/31/23 3086 08/31/23 0645   08/31/23 0645  cefoTEtan (CEFOTAN) 2 g in sodium chloride 0.9 % 100 mL IVPB        2 g 200 mL/hr over 30 Minutes Intravenous On call to  O.R. 08/31/23 0643 08/31/23 1510        Assessment/Plan: Patient Active Problem List   Diagnosis Date Noted   S/P hernia repair 08/31/2023   Lung nodule 11/20/2021   Seasonal allergies    Hypertension    Hypercholesteremia    Obesity    Lichenoid dermatitis    Diverticulitis    Hemorrhoid    s/p Procedure(s): XI ROBOTIC ASSISTED DIAGNOSTIC LAPAROSCOPY WITH EXTENSIVE LYSIS OF ADHESIONS DIAGNOSTIC FLEXIBLE SIGMOIDOSCOPY INCARCERATED INCISIONAL HERNIA REPAIR CYSTOSCOPY with FIREFLY INJECTION 08/31/2023  - Doing quite well - Soft diet as tolerated - Ambulate 5x/day as able - Abdominal binder ordered for comfort - Cont JP to bulb suction today - PPX: SQH, SCDs - Dispo: Awaiting return of bowel fxn - possible discharge 2/1   LOS: 2 days   I spent a total of 35 minutes in both face-to-face and non-face-to-face activities, excluding procedures performed, for this visit on the date of this encounter.  Marin Olp, MD [facility]

## 2023-09-02 NOTE — Discharge Instructions (Signed)
POST OP INSTRUCTIONS AFTER COLON SURGERY  DIET: Be sure to include lots of fluids daily to stay hydrated - 64oz of water per day (8, 8 oz glasses).  Avoid fast food or heavy meals for the first couple of weeks as your are more likely to get nauseated. Avoid raw/uncooked fruits or vegetables for the first 4 weeks (its ok to have these if they are blended into smoothie form). If you have fruits/vegetables, make sure they are cooked until soft enough to mash on the roof of your mouth and chew your food well. Otherwise, diet as tolerated.  Take your usually prescribed home medications unless otherwise directed.  PAIN CONTROL: Pain is best controlled by a usual combination of three different methods TOGETHER: Ice/Heat Over the counter pain medication Prescription pain medication Most patients will experience some swelling and bruising around the surgical site.  Ice packs or heating pads (30-60 minutes up to 6 times a day) will help. Some people prefer to use ice alone, heat alone, alternating between ice & heat.  Experiment to what works for you.  Swelling and bruising can take several weeks to resolve.   It is helpful to take an over-the-counter pain medication regularly for the first few weeks: Ibuprofen (Motrin/Advil) - 200mg tabs - take 3 tabs (600mg) every 6 hours as needed for pain (unless you have been directed previously to avoid NSAIDs/ibuprofen) Acetaminophen (Tylenol) - you may take 650mg every 6 hours as needed. You can take this with motrin as they act differently on the body. If you are taking a narcotic pain medication that has acetaminophen in it, do not take over the counter tylenol at the same time. NOTE: You may take both of these medications together - most patients  find it most helpful when alternating between the two (i.e. Ibuprofen at 6am, tylenol at 9am, ibuprofen at 12pm ...) A  prescription for pain medication should be given to you upon discharge.  Take your pain medication as  prescribed if your pain is not adequatly controlled with the over-the-counter pain reliefs mentioned above.  Avoid getting constipated.  Between the surgery and the pain medications, it is common to experience some constipation.  Increasing fluid intake and taking a fiber supplement (such as Metamucil, Citrucel, FiberCon, MiraLax, etc) 1-2 times a day regularly will usually help prevent this problem from occurring.  A mild laxative (prune juice, Milk of Magnesia, MiraLax, etc) should be taken according to package directions if there are no bowel movements after 48 hours.    Dressing: Your incisions are covered in Dermabond which is like sterile superglue for the skin. This will come off on it's own in a couple weeks. It is waterproof and you may bathe normally starting the day after your surgery in a shower. Avoid baths/pools/lakes/oceans until your wounds have fully healed.  ACTIVITIES as tolerated:   Avoid heavy lifting (>10lbs or 1 gallon of milk) for the next 6 weeks. You may resume regular daily activities as tolerated--such as daily self-care, walking, climbing stairs--gradually increasing activities as tolerated.  If you can walk 30 minutes without difficulty, it is safe to try more intense activity such as jogging, treadmill, bicycling, low-impact aerobics.  DO NOT PUSH THROUGH PAIN.  Let pain be your guide: If it hurts to do something, don't do it. You may drive when you are no longer taking prescription pain medication, you can comfortably wear a seatbelt, and you can safely maneuver your car and apply brakes.  FOLLOW UP in our   office Please call [facility] at [phone] to set up an appointment to see your surgeon in the office for a follow-up appointment approximately 2 weeks after your surgery. Make sure that you call for this appointment the day you arrive home to insure a convenient appointment time.  9. If you have disability or family leave forms that need to be completed, you may have  them completed by your primary care physician's office; for return to work instructions, please ask our office staff and they will be happy to assist you in obtaining this documentation   When to call us [phone]: Poor pain control Reactions / problems with new medications (rash/itching, etc)  Fever over 101.5 F (38.5 C) Inability to urinate Nausea/vomiting Worsening swelling or bruising Continued bleeding from incision. Increased pain, redness, or drainage from the incision  The clinic staff is available to answer your questions during regular business hours (8:30am-5pm).  Please don't hesitate to call and ask to speak to one of our nurses for clinical concerns.   A surgeon from [facility] is always on call at the hospitals   If you have a medical emergency, go to the nearest emergency room or call 911.  [facility] [address], [address], [place], [place]  [address] MAIN: [phone] FAX: [phone] [email or web address]  

## 2023-09-03 LAB — CBC
HCT: 33 % — ABNORMAL LOW (ref 36.0–46.0)
Hemoglobin: 10.9 g/dL — ABNORMAL LOW (ref 12.0–15.0)
MCH: 30.7 pg (ref 26.0–34.0)
MCHC: 33 g/dL (ref 30.0–36.0)
MCV: 93 fL (ref 80.0–100.0)
Platelets: 228 10*3/uL (ref 150–400)
RBC: 3.55 MIL/uL — ABNORMAL LOW (ref 3.87–5.11)
RDW: 14.3 % (ref 11.5–15.5)
WBC: 7.6 10*3/uL (ref 4.0–10.5)
nRBC: 0 % (ref 0.0–0.2)

## 2023-09-03 LAB — BASIC METABOLIC PANEL
Anion gap: 10 (ref 5–15)
BUN: 17 mg/dL (ref 8–23)
CO2: 25 mmol/L (ref 22–32)
Calcium: 8.8 mg/dL — ABNORMAL LOW (ref 8.9–10.3)
Chloride: 100 mmol/L (ref 98–111)
Creatinine, Ser: 0.87 mg/dL (ref 0.44–1.00)
GFR, Estimated: >60 mL/min (ref >60)
Glucose, Bld: 91 mg/dL (ref 70–99)
Potassium: 3.6 mmol/L (ref 3.5–5.1)
Sodium: 135 mmol/L (ref 135–145)

## 2023-09-03 MED ORDER — PROCHLORPERAZINE EDISYLATE 10 MG/2ML IJ SOLN
5.0000 mg | INTRAMUSCULAR | Status: DC | PRN
  Administered 2023-09-05: 10 mg via INTRAVENOUS
  Filled 2023-09-03: qty 2

## 2023-09-03 NOTE — Progress Notes (Signed)
   09/03/23 0044  BiPAP/CPAP/SIPAP  $ Non-Invasive Home Ventilator  Subsequent  BiPAP/CPAP/SIPAP Pt Type Adult  BiPAP/CPAP/SIPAP DREAMSTATIOND  Mask Type Nasal mask  FiO2 (%) 21 %  Patient Home Equipment No  Auto Titrate Yes (6-20 cmH2O)

## 2023-09-03 NOTE — Progress Notes (Signed)
   09/03/23 2041  BiPAP/CPAP/SIPAP  BiPAP/CPAP/SIPAP Pt Type Adult (prefer self placement machine ready to be used)  BiPAP/CPAP/SIPAP DREAMSTATIOND  Respiratory Rate 17 breaths/min  FiO2 (%) 21 %  Patient Home Equipment No (only mask and tubing)  Auto Titrate Yes (20-6 previous night settings)  CPAP/SIPAP surface wiped down Yes  BiPAP/CPAP /SiPAP Vitals  Pulse Rate 77  Resp 20  SpO2 97 %  Bilateral Breath Sounds Clear  MEWS Score/Color  MEWS Score 0  MEWS Score Color Green

## 2023-09-03 NOTE — Progress Notes (Signed)
09/03/2023  Natasha Wilkinson [id number] 19-Dec-1960  CARE TEAM: PCP: Noberto Retort, MD  Outpatient Care Team: Patient Care Team: Noberto Retort, MD as PCP - General (Family Medicine) Donnetta Hail, MD as Consulting Physician (Rheumatology)  Inpatient Treatment Team: Treatment Team:  Andria Meuse, MD Merwyn Katos, NT Saddie Benders, RN Deveron Furlong, RN Redmond Baseman, RN [place], Shana Chute, [facility]   Problem List:   Principal Problem:   S/P hernia repair   08/31/2023  POSTOP DIAGNOSIS: COMPLICATED DIVERTICULITIS, POSSIBLE COLOVESICAL FISTULA   PROCEDURE:  Robotic assisted repair of incarcerated incisional hernia 5 x 5 cm Robotic assisted lysis of adhesions x 120 minutes, extensive Diagnostic flexible sigmoidoscopy Bilateral transversus abdominus plane (TAP) blocks   Planned sigmoidectomy/low anterior resection ultimately aborted   SURGEON: Stephanie Coup. White, MD   FINDINGS: Bowel containing incarcerated incisional hernia which also measured 5 x 5 cm and was Swiss cheese type defect.  Adhesions related prior surgery but additionally, dense pelvic adhesions from prior surgery for endometriosis.  Ultimately, the mid sigmoid colon was found to be densely adherent over the left iliac artery with the left ureter immediately medial and inferior.  We were able to straighten out her sigmoid colon by releasing some of her pelvic adhesions.  Endoscopically, there is no intraluminal mass or other concerning findings.  There is also no evident stricture or narrowing.  Given these constellation of findings, we opted to not attempt resection of the sigmoid colon given the increased risk for injury to both the iliac artery and left ureter which are intimately associated.  Incisional hernia was primarily closed without mesh given the nature of the procedure and preoperative discussions.    Assessment [facility] Stay = 3 days) 3 Days Post-Op    Gradually  improving    Plan:  -Solid diet -Bowel regimen -Pain control -VTE prophylaxis- SCDs, etc -mobilize as tolerated to help recovery Hopefully we discharged tomorrow Monday once has bowel movement and feeling better.    I reviewed last 24 h vitals and pain scores, last 48 h intake and output, last 24 h labs and trends, and last 24 h imaging results.  I have reviewed this patient's available data, including medical history, events of note, test results, etc as part of my evaluation.   A significant portion of that time was spent in counseling. Care during the described time interval was provided by me.  This care required straight-forward level of medical decision making.  09/03/2023    Subjective: (Chief complaint)  Patient started to tolerate solid diet.  Think she has passed some gas but no bowel moods.  Pain mostly controlled.  Walking more.  Objective:  Vital signs:  Vitals:   09/02/23 1336 09/02/23 2014 09/03/23 0500 09/03/23 0604  BP: 116/68 125/79  113/76  Pulse: 67 69  (!) 59  Resp: 18 18  17   Temp: 97.8 F (36.6 C) 98.2 F (36.8 C)  98.6 F (37 C)  TempSrc: Oral Oral  Oral  SpO2: 98% 98%  95%  Weight:   98.6 kg   Height:        Last BM Date : 08/30/23  Intake/Output   Yesterday:  01/31 0701 - 02/01 0700 In: 870 [P.O.:870] Out: 1325 [Urine:1125; Drains:200] This shift:  No intake/output data recorded.  Bowel function:  Flatus: YES  BM:  No  Drain: Serosanguinous   Physical Exam:  General: Pt awake/alert in no acute distress..  Comfortable. Eyes: PERRL, normal EOM.  Sclera  clear.  No icterus Neuro: CN II-XII intact w/o focal sensory/motor deficits. Lymph: No head/neck/groin lymphadenopathy Psych:  No delerium/psychosis/paranoia.  Oriented x 4 HENT: Normocephalic, Mucus membranes moist.  No thrush Neck: Supple, No tracheal deviation.  No obvious thyromegaly Chest: No pain to chest wall compression.  Good respiratory excursion.  No  audible wheezing CV:  Pulses intact.  Regular rhythm.  No major extremity edema MS: Normal AROM mjr joints.  No obvious deformity  Abdomen: Soft.  Mildy distended.  Mildly tender at incisions only.  No evidence of peritonitis.  No incarcerated hernias.  Ext:   No deformity.  No mjr edema.  No cyanosis Skin: No petechiae / purpurea.  No major sores.  Warm and dry    Results:   Cultures: No results found for this or any previous visit (from the past 720 hours).  Labs: Results for orders placed or performed during the hospital encounter of 08/31/23 (from the past 48 hours)  CBC     Status: Abnormal   Collection Time: 09/02/23  4:32 AM  Result Value Ref Range   WBC 7.5 4.0 - 10.5 K/uL   RBC 3.42 (L) 3.87 - 5.11 MIL/uL   Hemoglobin 10.4 (L) 12.0 - 15.0 g/dL   HCT 09.8 (L) 11.9 - 14.7 %   MCV 92.7 80.0 - 100.0 fL   MCH 30.4 26.0 - 34.0 pg   MCHC 32.8 30.0 - 36.0 g/dL   RDW 82.9 56.2 - 13.0 %   Platelets 221 150 - 400 K/uL   nRBC 0.0 0.0 - 0.2 %    Comment: Performed at [facility], [address] [place], [place] [address]  Basic metabolic panel     Status: Abnormal   Collection Time: 09/02/23  4:32 AM  Result Value Ref Range   Sodium 134 (L) 135 - 145 mmol/L   Potassium 3.6 3.5 - 5.1 mmol/L   Chloride 103 98 - 111 mmol/L   CO2 25 22 - 32 mmol/L   Glucose, Bld 103 (H) 70 - 99 mg/dL    Comment: Glucose reference range applies only to samples taken after fasting for at least 8 hours.   BUN 14 8 - 23 mg/dL   Creatinine, Ser 4.69 0.44 - 1.00 mg/dL   Calcium 8.7 (L) 8.9 - 10.3 mg/dL   GFR, Estimated >62 >95 mL/min    Comment: (NOTE) Calculated using the CKD-EPI Creatinine Equation (2021)    Anion gap 6 5 - 15    Comment: Performed at [facility], [address], [place], [place] [address]  CBC     Status: Abnormal   Collection Time: 09/03/23  5:03 AM  Result Value Ref Range   WBC 7.6 4.0 - 10.5 K/uL   RBC 3.55 (L) 3.87 - 5.11 MIL/uL    Hemoglobin 10.9 (L) 12.0 - 15.0 g/dL   HCT 24.4 (L) 01.0 - 27.2 %   MCV 93.0 80.0 - 100.0 fL   MCH 30.7 26.0 - 34.0 pg   MCHC 33.0 30.0 - 36.0 g/dL   RDW 53.6 64.4 - 03.4 %   Platelets 228 150 - 400 K/uL   nRBC 0.0 0.0 - 0.2 %    Comment: Performed at [facility], [address], [place], [place] [address]  Basic metabolic panel     Status: Abnormal   Collection Time: 09/03/23  5:03 AM  Result Value Ref Range   Sodium 135 135 - 145 mmol/L   Potassium 3.6 3.5 -  5.1 mmol/L   Chloride 100 98 - 111 mmol/L   CO2 25 22 - 32 mmol/L   Glucose, Bld 91 70 - 99 mg/dL    Comment: Glucose reference range applies only to samples taken after fasting for at least 8 hours.   BUN 17 8 - 23 mg/dL   Creatinine, Ser 5.28 0.44 - 1.00 mg/dL   Calcium 8.8 (L) 8.9 - 10.3 mg/dL   GFR, Estimated >41 >32 mL/min    Comment: (NOTE) Calculated using the CKD-EPI Creatinine Equation (2021)    Anion gap 10 5 - 15    Comment: Performed at [facility], [address], [place], [place] [address]    Imaging / Studies: No results found.  Medications / Allergies: per chart  Antibiotics: Anti-infectives (From admission, onward)    Start     Dose/Rate Route Frequency Ordered Stop   08/31/23 1400  neomycin (MYCIFRADIN) tablet 1,000 mg  Status:  Discontinued       Placed in "And" Linked Group   1,000 mg Oral 3 times per day 08/31/23 0643 08/31/23 0645   08/31/23 1400  metroNIDAZOLE (FLAGYL) tablet 1,000 mg  Status:  Discontinued       Placed in "And" Linked Group   1,000 mg Oral 3 times per day 08/31/23 0643 08/31/23 0645   08/31/23 0645  cefoTEtan (CEFOTAN) 2 g in sodium chloride 0.9 % 100 mL IVPB        2 g 200 mL/hr over 30 Minutes Intravenous On call to O.R. 08/31/23 2725 08/31/23 1510         Note: Portions of this report may have been transcribed using voice recognition software. Every effort was made to ensure accuracy; however, inadvertent computerized  transcription errors may be present.   Any transcriptional errors that result from this process are unintentional.    Ardeth Sportsman, MD, FACS, MASCRS Esophageal, Gastrointestinal & Colorectal Surgery Robotic and Minimally Invasive Surgery  [facility] [address], [address] [place], [place] [address] [phone] Fax [phone] Main  CONTACT INFORMATION: Weekday (9AM-5PM): Call [facility] main office at [phone] Weeknight (5PM-9AM) or Weekend/Holiday: Check EPIC "Web Links" tab & use "AMION" (password " [email or web address]") for General Surgery [facility] coverage  Please, DO NOT use SecureChat  (it is not reliable communication to reach operating surgeons & will lead to a delay in care).   Epic staff messaging available for outptient concerns needing 1-2 business day response.      09/03/2023  9:05 AM

## 2023-09-04 MED ORDER — ADULT MULTIVITAMIN W/MINERALS CH
1.0000 | ORAL_TABLET | Freq: Every day | ORAL | Status: DC
Start: 2023-09-04 — End: 2023-09-09
  Administered 2023-09-04 – 2023-09-08 (×4): 1 via ORAL
  Filled 2023-09-04 (×4): qty 1

## 2023-09-04 MED ORDER — SIMETHICONE 80 MG PO CHEW
40.0000 mg | CHEWABLE_TABLET | Freq: Four times a day (QID) | ORAL | Status: AC
  Administered 2023-09-04 (×2): 40 mg via ORAL
  Filled 2023-09-04 (×2): qty 1

## 2023-09-04 NOTE — Progress Notes (Addendum)
09/04/2023  Natasha Wilkinson [id number] 1961/05/01  CARE TEAM: PCP: Noberto Retort, MD  Outpatient Care Team: Patient Care Team: Noberto Retort, MD as PCP - General (Family Medicine) Donnetta Hail, MD as Consulting Physician (Rheumatology)  Inpatient Treatment Team: Treatment Team:  Andria Meuse, MD Wilford Corner, RN Misty Stanley, NT Deveron Furlong, RN Sherian Maroon, RN   Problem List:   Principal Problem:   S/P hernia repair   08/31/2023  POSTOP DIAGNOSIS: COMPLICATED DIVERTICULITIS, POSSIBLE COLOVESICAL FISTULA   PROCEDURE:  Robotic assisted repair of incarcerated incisional hernia 5 x 5 cm Robotic assisted lysis of adhesions x 120 minutes, extensive Diagnostic flexible sigmoidoscopy Bilateral transversus abdominus plane (TAP) blocks   Planned sigmoidectomy/low anterior resection ultimately aborted   SURGEON: Stephanie Coup. White, MD   FINDINGS: Bowel containing incarcerated incisional hernia which also measured 5 x 5 cm and was Swiss cheese type defect.  Adhesions related prior surgery but additionally, dense pelvic adhesions from prior surgery for endometriosis.  Ultimately, the mid sigmoid colon was found to be densely adherent over the left iliac artery with the left ureter immediately medial and inferior.  We were able to straighten out her sigmoid colon by releasing some of her pelvic adhesions.  Endoscopically, there is no intraluminal mass or other concerning findings.  There is also no evident stricture or narrowing.  Given these constellation of findings, we opted to not attempt resection of the sigmoid colon given the increased risk for injury to both the iliac artery and left ureter which are intimately associated.  Incisional hernia was primarily closed without mesh given the nature of the procedure and preoperative discussions.    Assessment [facility] Stay = 4 days) 4 Days Post-Op    Fair   Plan:  -Go back down to a  full liquid diet and try readvancing later today -Bowel regimen -low threshold to hold if she feels worsening cramping. -Pain control -Hypertension control with metoprolol. -VTE prophylaxis- SCDs, etc -mobilize as tolerated to help recovery  Discussed with patient at bedside.  She initially had problems hearing everything so turn the TV off and explained 1 more time.  I think that helped her focus.  Family came by and I updated and discussed with them as well.  Questions answered.  They expressed understanding and appreciation   I reviewed last 24 h vitals and pain scores, last 48 h intake and output, last 24 h labs and trends, and last 24 h imaging results.  I have reviewed this patient's available data, including medical history, events of note, test results, etc as part of my evaluation.   A significant portion of that time was spent in counseling. Care during the described time interval was provided by me.  This care required straight-forward level of medical decision making.  09/04/2023    Subjective: (Chief complaint)  Patient with some nausea and bloating with solid food.  Having less flatus.  No bowel movements.  Having some crampy-like pain.  Worried.  Walking in hallways.  Objective:  Vital signs:  Vitals:   09/03/23 2041 09/03/23 2206 09/04/23 0404 09/04/23 0645  BP:  (!) 142/69  116/69  Pulse: 77 85  63  Resp: 20 14  14   Temp:  98.3 F (36.8 C)  98 F (36.7 C)  TempSrc:  Oral  Oral  SpO2: 97% 97%  96%  Weight:   98.6 kg   Height:        Last BM Date :  08/30/23  Intake/Output   Yesterday:  02/01 0701 - 02/02 0700 In: 600 [P.O.:600] Out: 1110 [Urine:950; Drains:160] This shift:  No intake/output data recorded.  Bowel function:  Flatus: YES - less  BM:  No  Drain: Serosanguinous   Physical Exam:  General: Pt awake/alert in no acute distress..  Sitting up in chair.  Not toxic not sickly.  Not tired.   Eyes: PERRL, normal EOM.  Sclera clear.   No icterus Neuro: CN II-XII intact w/o focal sensory/motor deficits. Lymph: No head/neck/groin lymphadenopathy Psych:  No delerium/psychosis/paranoia.  Mildly anxious but consolable.  Oriented x 4 HENT: Normocephalic, Mucus membranes moist.  No thrush Neck: Supple, No tracheal deviation.  No obvious thyromegaly Chest: No pain to chest wall compression.  Good respiratory excursion.  No audible wheezing CV:  Pulses intact.  Regular rhythm.  No major extremity edema MS: Normal AROM mjr joints.  No obvious deformity  Abdomen: Soft.  Mildy distended.  Mildly tender at incisions only.  Incisions clean dry and intact.  No evidence of peritonitis.  No incarcerated hernias.  Ext:   No deformity.  No mjr edema.  No cyanosis Skin: No petechiae / purpurea.  No major sores.  Warm and dry    Results:   Cultures: No results found for this or any previous visit (from the past 720 hours).  Labs: Results for orders placed or performed during the hospital encounter of 08/31/23 (from the past 48 hours)  CBC     Status: Abnormal   Collection Time: 09/03/23  5:03 AM  Result Value Ref Range   WBC 7.6 4.0 - 10.5 K/uL   RBC 3.55 (L) 3.87 - 5.11 MIL/uL   Hemoglobin 10.9 (L) 12.0 - 15.0 g/dL   HCT 98.1 (L) 19.1 - 47.8 %   MCV 93.0 80.0 - 100.0 fL   MCH 30.7 26.0 - 34.0 pg   MCHC 33.0 30.0 - 36.0 g/dL   RDW 29.5 62.1 - 30.8 %   Platelets 228 150 - 400 K/uL   nRBC 0.0 0.0 - 0.2 %    Comment: Performed at [facility], [address] [place], [place] [address]  Basic metabolic panel     Status: Abnormal   Collection Time: 09/03/23  5:03 AM  Result Value Ref Range   Sodium 135 135 - 145 mmol/L   Potassium 3.6 3.5 - 5.1 mmol/L   Chloride 100 98 - 111 mmol/L   CO2 25 22 - 32 mmol/L   Glucose, Bld 91 70 - 99 mg/dL    Comment: Glucose reference range applies only to samples taken after fasting for at least 8 hours.   BUN 17 8 - 23 mg/dL   Creatinine, Ser 6.96 0.44 - 1.00 mg/dL    Calcium 8.8 (L) 8.9 - 10.3 mg/dL   GFR, Estimated >29 >52 mL/min    Comment: (NOTE) Calculated using the CKD-EPI Creatinine Equation (2021)    Anion gap 10 5 - 15    Comment: Performed at [facility], [address] [place], [place] [address]    Imaging / Studies: No results found.  Medications / Allergies: per chart  Antibiotics: Anti-infectives (From admission, onward)    Start     Dose/Rate Route Frequency Ordered Stop   08/31/23 1400  neomycin (MYCIFRADIN) tablet 1,000 mg  Status:  Discontinued       Placed in "And" Linked Group   1,000 mg Oral 3 times per day 08/31/23 0643 08/31/23 0645  08/31/23 1400  metroNIDAZOLE (FLAGYL) tablet 1,000 mg  Status:  Discontinued       Placed in "And" Linked Group   1,000 mg Oral 3 times per day 08/31/23 0643 08/31/23 0645   08/31/23 0645  cefoTEtan (CEFOTAN) 2 g in sodium chloride 0.9 % 100 mL IVPB        2 g 200 mL/hr over 30 Minutes Intravenous On call to O.R. 08/31/23 0643 08/31/23 1510         Note: Portions of this report may have been transcribed using voice recognition software. Every effort was made to ensure accuracy; however, inadvertent computerized transcription errors may be present.   Any transcriptional errors that result from this process are unintentional.    Ardeth Sportsman, MD, FACS, MASCRS Esophageal, Gastrointestinal & Colorectal Surgery Robotic and Minimally Invasive Surgery  [facility] [address], [address] [place], [place] [address] [phone] Fax [phone] Main  CONTACT INFORMATION: Weekday (9AM-5PM): Call [facility] main office at [phone] Weeknight (5PM-9AM) or Weekend/Holiday: Check EPIC "Web Links" tab & use "AMION" (password " [email or web address]") for General Surgery [facility] coverage  Please, DO NOT use SecureChat  (it is not reliable communication to reach operating surgeons & will lead to a delay in care).   Epic staff  messaging available for outptient concerns needing 1-2 business day response.      09/04/2023  8:17 AM

## 2023-09-04 NOTE — Progress Notes (Signed)
Dr. Michaell Cowing aware via phone JP drain leaking and not charging. Nurse found that the tube was split near insertion site. MD ordered to discontinue drain.

## 2023-09-04 NOTE — Progress Notes (Signed)
   09/04/23 2339  BiPAP/CPAP/SIPAP  BiPAP/CPAP/SIPAP Pt Type Adult (self placement)  BiPAP/CPAP/SIPAP DREAMSTATIOND  Mask Type Nasal mask  Respiratory Rate 18 breaths/min  FiO2 (%) 21 %  Patient Home Equipment No  Auto Titrate Yes (20-6)

## 2023-09-04 NOTE — Plan of Care (Signed)

## 2023-09-05 ENCOUNTER — Inpatient Hospital Stay (HOSPITAL_COMMUNITY): Payer: 59

## 2023-09-05 LAB — CBC WITH DIFFERENTIAL/PLATELET
Abs Immature Granulocytes: 0.11 10*3/uL — ABNORMAL HIGH (ref 0.00–0.07)
Basophils Absolute: 0 10*3/uL (ref 0.0–0.1)
Basophils Relative: 0 %
Eosinophils Absolute: 0.2 10*3/uL (ref 0.0–0.5)
Eosinophils Relative: 1 %
HCT: 36.6 % (ref 36.0–46.0)
Hemoglobin: 12.3 g/dL (ref 12.0–15.0)
Immature Granulocytes: 1 %
Lymphocytes Relative: 10 %
Lymphs Abs: 1.1 10*3/uL (ref 0.7–4.0)
MCH: 30.1 pg (ref 26.0–34.0)
MCHC: 33.6 g/dL (ref 30.0–36.0)
MCV: 89.7 fL (ref 80.0–100.0)
Monocytes Absolute: 1.1 10*3/uL — ABNORMAL HIGH (ref 0.1–1.0)
Monocytes Relative: 10 %
Neutro Abs: 8.8 10*3/uL — ABNORMAL HIGH (ref 1.7–7.7)
Neutrophils Relative %: 78 %
Platelets: 360 10*3/uL (ref 150–400)
RBC: 4.08 MIL/uL (ref 3.87–5.11)
RDW: 14.4 % (ref 11.5–15.5)
WBC: 11.2 10*3/uL — ABNORMAL HIGH (ref 4.0–10.5)
nRBC: 0 % (ref 0.0–0.2)

## 2023-09-05 LAB — BASIC METABOLIC PANEL
Anion gap: 11 (ref 5–15)
BUN: 14 mg/dL (ref 8–23)
CO2: 25 mmol/L (ref 22–32)
Calcium: 9.2 mg/dL (ref 8.9–10.3)
Chloride: 93 mmol/L — ABNORMAL LOW (ref 98–111)
Creatinine, Ser: 0.54 mg/dL (ref 0.44–1.00)
GFR, Estimated: >60 mL/min (ref >60)
Glucose, Bld: 124 mg/dL — ABNORMAL HIGH (ref 70–99)
Potassium: 3.2 mmol/L — ABNORMAL LOW (ref 3.5–5.1)
Sodium: 129 mmol/L — ABNORMAL LOW (ref 135–145)

## 2023-09-05 MED ORDER — POTASSIUM CHLORIDE 10 MEQ/100ML IV SOLN
10.0000 meq | INTRAVENOUS | Status: AC
Start: 2023-09-05 — End: 2023-09-05
  Administered 2023-09-05 (×4): 10 meq via INTRAVENOUS
  Filled 2023-09-05: qty 100

## 2023-09-05 MED ORDER — SODIUM CHLORIDE 0.9 % IV SOLN
2.0000 g | Freq: Once | INTRAVENOUS | Status: AC
  Administered 2023-09-06: 2 g via INTRAVENOUS
  Filled 2023-09-05: qty 2

## 2023-09-05 MED ORDER — IOHEXOL 300 MG/ML  SOLN
100.0000 mL | Freq: Once | INTRAMUSCULAR | Status: AC | PRN
  Administered 2023-09-05: 100 mL via INTRAVENOUS

## 2023-09-05 MED ORDER — SODIUM CHLORIDE 0.9 % IV SOLN: INTRAVENOUS | Status: AC

## 2023-09-05 MED ORDER — IOHEXOL 300 MG/ML  SOLN
15.0000 mL | INTRAMUSCULAR | Status: AC
Start: 2023-09-05 — End: 2023-09-05

## 2023-09-05 NOTE — Progress Notes (Addendum)
Patient seen and examined late this afternoon following CT.   Currently stable with regards to mild to moderate distention. Has had some bowel fxn this afternoon although no large bowel movement. No n/v. NPO since this afternoon.  AF VSS NAD, up in chair Abd soft, stable distention, mild incisional tenderness, no rebound nor guarding  CT reviewed with rads and again at bedside with her husband Caryn Bee. There is mucosal edema at this junction, I suspect 2/2 recent surgery with mobilization of significant dense scar in this area. Although endoluminally widely patent at conclusion of her procedure, I suspect swelling has occurred at this location and is at least partially obstructing.   We have discussed plans moving forward. We discussed proceeding to OR for diverting loop colostomy. She understandably wants to be sure nothing will begin to move through before committing but is willing to do so if necessary. If things do not dramatically improve clinically over the next 12 hours, I do think we should proceed with re-exploration and planned diverting loop colostomy.   We spent time going over all this with her and her husband. We discussed again today findings at her recent procedure and concerns for involvement of pelvic side wall from scarring related to prior endometriosis and resultant pelvic surgery.   We discussed general expectations as well with regards to colostomy and mechanics of a loop.  All of her and her husband's questions were answered to their satisfaction, they expressed understanding and agreement with proceeding if no significant improvement.  I have discussed this with the OR and have her tentatively scheduled for 1st case in AM provided no improvement.    Marin Olp, MD [facility]

## 2023-09-05 NOTE — Progress Notes (Addendum)
  Subjective No acute events. Intermittent crampy pains in abdomen and flank. No n/v. Tolerating liquids. Up walking around. JP had leakage around and possibly (?) a crack in the tube and was removed by my partner yesterday. Had 2 BM yesterday and another today.  Objective: Vital signs in last 24 hours: Temp:  [97.9 F (36.6 C)-98.8 F (37.1 C)] 97.9 F (36.6 C) (02/03 0621) Pulse Rate:  [61-71] 61 (02/03 0621) Resp:  [14-16] 16 (02/03 0621) BP: (114-143)/(58-84) 118/82 (02/03 0621) SpO2:  [96 %-98 %] 98 % (02/03 0621) FiO2 (%):  [21 %] 21 % (02/02 2339) Weight:  [98.1 kg] 98.1 kg (02/03 0500) Last BM Date : 08/30/23  Intake/Output from previous day: 02/02 0701 - 02/03 0700 In: 1320 [P.O.:1320] Out: 1300 [Urine:1250; Drains:50] Intake/Output this shift: No intake/output data recorded.  Gen: NAD, comfortable CV: RRR Pulm: Normal work of breathing Abd: Soft, mildly distended; mildly tender around incisions. Serous drainage from drain site. Ext: SCDs in place  Lab Results: CBC  Recent Labs    09/03/23 0503  WBC 7.6  HGB 10.9*  HCT 33.0*  PLT 228   BMET Recent Labs    09/03/23 0503  NA 135  K 3.6  CL 100  CO2 25  GLUCOSE 91  BUN 17  CREATININE 0.87  CALCIUM 8.8*   PT/INR No results for input(s): "LABPROT", "INR" in the last 72 hours. ABG No results for input(s): "PHART", "HCO3" in the last 72 hours.  Invalid input(s): "PCO2", "PO2"  Studies/Results:  Anti-infectives: Anti-infectives (From admission, onward)    Start     Dose/Rate Route Frequency Ordered Stop   08/31/23 1400  neomycin (MYCIFRADIN) tablet 1,000 mg  Status:  Discontinued       Placed in "And" Linked Group   1,000 mg Oral 3 times per day 08/31/23 0643 08/31/23 0645   08/31/23 1400  metroNIDAZOLE (FLAGYL) tablet 1,000 mg  Status:  Discontinued       Placed in "And" Linked Group   1,000 mg Oral 3 times per day 08/31/23 1610 08/31/23 0645   08/31/23 0645  cefoTEtan (CEFOTAN) 2 g in sodium  chloride 0.9 % 100 mL IVPB        2 g 200 mL/hr over 30 Minutes Intravenous On call to O.R. 08/31/23 0643 08/31/23 1510        Assessment/Plan: Patient Active Problem List   Diagnosis Date Noted   S/P hernia repair 08/31/2023   Lung nodule 11/20/2021   Seasonal allergies    Hypertension    Hypercholesteremia    Obesity    Lichenoid dermatitis    Diverticulitis    Hemorrhoid    s/p Procedure(s): XI ROBOTIC ASSISTED DIAGNOSTIC LAPAROSCOPY WITH EXTENSIVE LYSIS OF ADHESIONS DIAGNOSTIC FLEXIBLE SIGMOIDOSCOPY INCARCERATED INCISIONAL HERNIA REPAIR CYSTOSCOPY with FIREFLY INJECTION 08/31/2023  - Cont liquid diet today; will check CT Abd/Pelvis for further evaluation with PO and IV contrast - Labs pending - Ambulate 5x/day as able - Abdominal binder ordered for comfort - JP was removed yesterday - PPX: SQH, SCDs  I spent a total of 35 minutes in both face-to-face and non-face-to-face activities, excluding procedures performed, for this visit on the date of this encounter.   LOS: 5 days   Marin Olp, MD [facility]

## 2023-09-05 NOTE — Progress Notes (Signed)
   09/05/23 2331  BiPAP/CPAP/SIPAP  BiPAP/CPAP/SIPAP Pt Type Adult  BiPAP/CPAP/SIPAP DREAMSTATIOND  Mask Type Nasal mask  FiO2 (%) 21 %  Patient Home Equipment No  Auto Titrate Yes (6-20)

## 2023-09-06 ENCOUNTER — Other Ambulatory Visit: Payer: Self-pay

## 2023-09-06 ENCOUNTER — Encounter (HOSPITAL_COMMUNITY): Payer: Self-pay | Admitting: Surgery

## 2023-09-06 ENCOUNTER — Inpatient Hospital Stay (HOSPITAL_COMMUNITY): Payer: 59 | Admitting: Anesthesiology

## 2023-09-06 ENCOUNTER — Encounter (HOSPITAL_COMMUNITY)
Admission: RE | Disposition: A | Discharge status: Home Health | Payer: Self-pay | Source: Home / Self Care | Attending: Surgery

## 2023-09-06 DIAGNOSIS — G4733 Obstructive sleep apnea (adult) (pediatric): Secondary | ICD-10-CM

## 2023-09-06 DIAGNOSIS — I10 Essential (primary) hypertension: Secondary | ICD-10-CM

## 2023-09-06 DIAGNOSIS — E7800 Pure hypercholesterolemia, unspecified: Secondary | ICD-10-CM

## 2023-09-06 DIAGNOSIS — K5792 Diverticulitis of intestine, part unspecified, without perforation or abscess without bleeding: Secondary | ICD-10-CM | POA: Diagnosis not present

## 2023-09-06 HISTORY — PX: LAPAROTOMY: SHX154

## 2023-09-06 LAB — BASIC METABOLIC PANEL
Anion gap: 12 (ref 5–15)
BUN: 15 mg/dL (ref 8–23)
CO2: 20 mmol/L — ABNORMAL LOW (ref 22–32)
Calcium: 8.8 mg/dL — ABNORMAL LOW (ref 8.9–10.3)
Chloride: 98 mmol/L (ref 98–111)
Creatinine, Ser: 0.55 mg/dL (ref 0.44–1.00)
GFR, Estimated: >60 mL/min (ref >60)
Glucose, Bld: 101 mg/dL — ABNORMAL HIGH (ref 70–99)
Potassium: 3.6 mmol/L (ref 3.5–5.1)
Sodium: 130 mmol/L — ABNORMAL LOW (ref 135–145)

## 2023-09-06 LAB — CBC WITH DIFFERENTIAL/PLATELET
Abs Immature Granulocytes: 0.12 10*3/uL — ABNORMAL HIGH (ref 0.00–0.07)
Basophils Absolute: 0 10*3/uL (ref 0.0–0.1)
Basophils Relative: 0 %
Eosinophils Absolute: 0.2 10*3/uL (ref 0.0–0.5)
Eosinophils Relative: 2 %
HCT: 34.9 % — ABNORMAL LOW (ref 36.0–46.0)
Hemoglobin: 11.5 g/dL — ABNORMAL LOW (ref 12.0–15.0)
Immature Granulocytes: 1 %
Lymphocytes Relative: 11 %
Lymphs Abs: 1 10*3/uL (ref 0.7–4.0)
MCH: 30.6 pg (ref 26.0–34.0)
MCHC: 33 g/dL (ref 30.0–36.0)
MCV: 92.8 fL (ref 80.0–100.0)
Monocytes Absolute: 1.1 10*3/uL — ABNORMAL HIGH (ref 0.1–1.0)
Monocytes Relative: 11 %
Neutro Abs: 7.3 10*3/uL (ref 1.7–7.7)
Neutrophils Relative %: 75 %
Platelets: 310 10*3/uL (ref 150–400)
RBC: 3.76 MIL/uL — ABNORMAL LOW (ref 3.87–5.11)
RDW: 14.5 % (ref 11.5–15.5)
WBC: 9.7 10*3/uL (ref 4.0–10.5)
nRBC: 0 % (ref 0.0–0.2)

## 2023-09-06 LAB — SURGICAL PCR SCREEN
MRSA, PCR: NEGATIVE
Staphylococcus aureus: NEGATIVE

## 2023-09-06 LAB — TYPE AND SCREEN
ABO/RH(D): O NEG
Antibody Screen: NEGATIVE

## 2023-09-06 LAB — GLUCOSE, CAPILLARY: Glucose-Capillary: 111 mg/dL — ABNORMAL HIGH (ref 70–99)

## 2023-09-06 SURGERY — LAPAROTOMY, EXPLORATORY
Anesthesia: General

## 2023-09-06 MED ORDER — BUPIVACAINE-EPINEPHRINE 0.25% -1:200000 IJ SOLN
INTRAMUSCULAR | Status: AC
Start: 2023-09-06 — End: ?
  Filled 2023-09-06: qty 1

## 2023-09-06 MED ORDER — PHENYLEPHRINE HCL-NACL 20-0.9 MG/250ML-% IV SOLN
INTRAVENOUS | Status: DC | PRN
  Administered 2023-09-06: 40 ug/min via INTRAVENOUS

## 2023-09-06 MED ORDER — LIDOCAINE HCL (CARDIAC) PF 100 MG/5ML IV SOSY
PREFILLED_SYRINGE | INTRAVENOUS | Status: DC | PRN
  Administered 2023-09-06: 60 mg via INTRAVENOUS

## 2023-09-06 MED ORDER — ROCURONIUM BROMIDE 100 MG/10ML IV SOLN
INTRAVENOUS | Status: DC | PRN
  Administered 2023-09-06: 20 mg via INTRAVENOUS
  Administered 2023-09-06: 70 mg via INTRAVENOUS

## 2023-09-06 MED ORDER — OXYCODONE HCL 5 MG PO TABS: 5.0000 mg | ORAL_TABLET | Freq: Once | ORAL | Status: DC | PRN

## 2023-09-06 MED ORDER — KETAMINE HCL 50 MG/5ML IJ SOSY
PREFILLED_SYRINGE | INTRAMUSCULAR | Status: AC
  Filled 2023-09-06: qty 5

## 2023-09-06 MED ORDER — ONDANSETRON HCL 4 MG/2ML IJ SOLN: 4.0000 mg | Freq: Once | INTRAMUSCULAR | Status: DC | PRN

## 2023-09-06 MED ORDER — HYDROMORPHONE HCL 2 MG/ML IJ SOLN
INTRAMUSCULAR | Status: AC
  Filled 2023-09-06: qty 1

## 2023-09-06 MED ORDER — 0.9 % SODIUM CHLORIDE (POUR BTL) OPTIME
TOPICAL | Status: DC | PRN
  Administered 2023-09-06 (×3): 1000 mL

## 2023-09-06 MED ORDER — MEPERIDINE HCL 50 MG/ML IJ SOLN: 6.2500 mg | INTRAMUSCULAR | Status: DC | PRN

## 2023-09-06 MED ORDER — ACETAMINOPHEN 160 MG/5ML PO SOLN: 325.0000 mg | ORAL | Status: DC | PRN

## 2023-09-06 MED ORDER — LACTATED RINGERS IV SOLN: INTRAVENOUS | Status: DC | PRN

## 2023-09-06 MED ORDER — SCOPOLAMINE 1 MG/3DAYS TD PT72SCOPOLAMINE 1 MG/3DAYS
1.0000 | MEDICATED_PATCH | TRANSDERMAL | Status: DC
Start: 2023-09-06 — End: 2023-09-06
  Administered 2023-09-06: 1.5 mg via TRANSDERMAL

## 2023-09-06 MED ORDER — MIDAZOLAM HCL 5 MG/5ML IJ SOLN
INTRAMUSCULAR | Status: DC | PRN
  Administered 2023-09-06: 2 mg via INTRAVENOUS

## 2023-09-06 MED ORDER — BUPIVACAINE LIPOSOME 1.3 % IJ SUSP
INTRAMUSCULAR | Status: AC
  Filled 2023-09-06: qty 20

## 2023-09-06 MED ORDER — HYDROMORPHONE HCL 1 MG/ML IJ SOLN
INTRAMUSCULAR | Status: DC | PRN
  Administered 2023-09-06 (×2): .5 mg via INTRAVENOUS

## 2023-09-06 MED ORDER — PROPOFOL 10 MG/ML IV BOLUS
INTRAVENOUS | Status: DC | PRN
  Administered 2023-09-06: 120 mg via INTRAVENOUS
  Administered 2023-09-06: 50 mg via INTRAVENOUS

## 2023-09-06 MED ORDER — CHLORHEXIDINE GLUCONATE 0.12 % MT SOLN
15.0000 mL | Freq: Once | OROMUCOSAL | Status: AC
  Administered 2023-09-06: 15 mL via OROMUCOSAL

## 2023-09-06 MED ORDER — LACTATED RINGERS IV SOLN: INTRAVENOUS | Status: DC

## 2023-09-06 MED ORDER — DEXAMETHASONE SODIUM PHOSPHATE 10 MG/ML IJ SOLN
INTRAMUSCULAR | Status: AC
  Filled 2023-09-06: qty 1

## 2023-09-06 MED ORDER — BUPIVACAINE-EPINEPHRINE 0.25% -1:200000 IJ SOLN
INTRAMUSCULAR | Status: DC | PRN
  Administered 2023-09-06: 30 mL

## 2023-09-06 MED ORDER — FENTANYL CITRATE (PF) 100 MCG/2ML IJ SOLN
INTRAMUSCULAR | Status: AC
  Filled 2023-09-06: qty 2

## 2023-09-06 MED ORDER — PROPOFOL 10 MG/ML IV BOLUS
INTRAVENOUS | Status: AC
  Filled 2023-09-06: qty 20

## 2023-09-06 MED ORDER — SCOPOLAMINE 1 MG/3DAYS TD PT72
MEDICATED_PATCH | TRANSDERMAL | Status: AC
  Filled 2023-09-06: qty 1

## 2023-09-06 MED ORDER — ONDANSETRON HCL 4 MG/2ML IJ SOLN
INTRAMUSCULAR | Status: AC
  Filled 2023-09-06: qty 2

## 2023-09-06 MED ORDER — PHENYLEPHRINE HCL (PRESSORS) 10 MG/ML IV SOLN
INTRAVENOUS | Status: DC | PRN
  Administered 2023-09-06 (×3): 160 ug via INTRAVENOUS
  Administered 2023-09-06: 80 ug via INTRAVENOUS
  Administered 2023-09-06: 160 ug via INTRAVENOUS

## 2023-09-06 MED ORDER — MIDAZOLAM HCL 2 MG/2ML IJ SOLN
INTRAMUSCULAR | Status: AC
  Filled 2023-09-06: qty 2

## 2023-09-06 MED ORDER — ONDANSETRON HCL 4 MG/2ML IJ SOLN
INTRAMUSCULAR | Status: DC | PRN
  Administered 2023-09-06: 4 mg via INTRAVENOUS

## 2023-09-06 MED ORDER — KETAMINE HCL 10 MG/ML IJ SOLN
INTRAMUSCULAR | Status: DC | PRN
  Administered 2023-09-06: 30 mg via INTRAVENOUS

## 2023-09-06 MED ORDER — LIDOCAINE HCL (PF) 2 % IJ SOLN
INTRAMUSCULAR | Status: AC
  Filled 2023-09-06: qty 5

## 2023-09-06 MED ORDER — SUGAMMADEX SODIUM 200 MG/2ML IV SOLN
INTRAVENOUS | Status: DC | PRN
  Administered 2023-09-06: 200 mg via INTRAVENOUS

## 2023-09-06 MED ORDER — OXYCODONE HCL 5 MG/5ML PO SOLN: 5.0000 mg | Freq: Once | ORAL | Status: DC | PRN

## 2023-09-06 MED ORDER — FENTANYL CITRATE PF 50 MCG/ML IJ SOSY: 25.0000 ug | PREFILLED_SYRINGE | INTRAMUSCULAR | Status: DC | PRN

## 2023-09-06 MED ORDER — ACETAMINOPHEN 325 MG PO TABS: 325.0000 mg | ORAL_TABLET | ORAL | Status: DC | PRN

## 2023-09-06 MED ORDER — ORAL CARE MOUTH RINSE: 15.0000 mL | Freq: Once | OROMUCOSAL | Status: AC

## 2023-09-06 MED ORDER — ROCURONIUM BROMIDE 10 MG/ML (PF) SYRINGE
PREFILLED_SYRINGE | INTRAVENOUS | Status: AC
Start: 2023-09-06 — End: ?
  Filled 2023-09-06: qty 10

## 2023-09-06 MED ORDER — ALBUMIN HUMAN 5 % IV SOLN: INTRAVENOUS | Status: DC | PRN

## 2023-09-06 MED ORDER — BUPIVACAINE LIPOSOME 1.3 % IJ SUSP
INTRAMUSCULAR | Status: DC | PRN
  Administered 2023-09-06: 20 mL

## 2023-09-06 MED ORDER — DEXAMETHASONE SODIUM PHOSPHATE 10 MG/ML IJ SOLN
INTRAMUSCULAR | Status: DC | PRN
  Administered 2023-09-06: 8 mg via INTRAVENOUS

## 2023-09-06 MED ORDER — FENTANYL CITRATE (PF) 100 MCG/2ML IJ SOLN
INTRAMUSCULAR | Status: DC | PRN
  Administered 2023-09-06: 100 ug via INTRAVENOUS

## 2023-09-06 SURGICAL SUPPLY — 45 items
BAG COUNTER SPONGE SURGICOUNT (BAG) IMPLANT
BLADE EXTENDED COATED 6.5IN (ELECTRODE) ×1 IMPLANT
CATH TIEMANN FOLEY 18FR 5CC (CATHETERS) IMPLANT
CELLS DAT CNTRL 66122 CELL SVR (MISCELLANEOUS) ×1 IMPLANT
COVER MAYO STAND STRL (DRAPES) ×1 IMPLANT
DRAIN CHANNEL 19F RND (DRAIN) IMPLANT
DRAPE LAPAROSCOPIC ABDOMINAL (DRAPES) ×1 IMPLANT
DRAPE SHEET LG 3/4 BI-LAMINATE (DRAPES) IMPLANT
DRAPE UTILITY XL STRL (DRAPES) ×1 IMPLANT
DRAPE WARM FLUID 44X44 (DRAPES) ×1 IMPLANT
DRSG OPSITE POSTOP 4X8 (GAUZE/BANDAGES/DRESSINGS) IMPLANT
ELECT REM PT RETURN 15FT ADLT (MISCELLANEOUS) ×1 IMPLANT
GAUZE SPONGE 4X4 12PLY STRL (GAUZE/BANDAGES/DRESSINGS) ×1 IMPLANT
GLOVE ECLIPSE 8.0 STRL XLNG CF (GLOVE) ×2 IMPLANT
GLOVE INDICATOR 8.0 STRL GRN (GLOVE) ×1 IMPLANT
GOWN STRL REUS W/ TWL XL LVL3 (GOWN DISPOSABLE) ×2 IMPLANT
HANDLE SUCTION POOLE (INSTRUMENTS) ×1 IMPLANT
KIT BASIN OR (CUSTOM PROCEDURE TRAY) ×1 IMPLANT
KIT TURNOVER KIT A (KITS) IMPLANT
LEGGING LITHOTOMY PAIR STRL (DRAPES) IMPLANT
NDL HYPO 22X1.5 SAFETY MO (MISCELLANEOUS) IMPLANT
NEEDLE HYPO 22X1.5 SAFETY MO (MISCELLANEOUS) ×1 IMPLANT
PACK GENERAL/GYN (CUSTOM PROCEDURE TRAY) ×1 IMPLANT
PAD POSITIONING PINK XL (MISCELLANEOUS) ×1 IMPLANT
RETRACTOR WND ALEXIS 18 MED (MISCELLANEOUS) IMPLANT
RETRACTOR WND ALEXIS 25 LRG (MISCELLANEOUS) IMPLANT
RTRCTR WOUND ALEXIS 18CM MED (MISCELLANEOUS) ×1
RTRCTR WOUND ALEXIS 25CM LRG (MISCELLANEOUS) ×1
SPONGE T-LAP 18X18 ~~LOC~~+RFID (SPONGE) IMPLANT
STAPLER SKIN PROX WIDE 3.9 (STAPLE) ×1 IMPLANT
SUCTION POOLE HANDLE (INSTRUMENTS) ×2
SUT ETHILON 2 0 PS N (SUTURE) IMPLANT
SUT ETHILON 3 0 PS 1 (SUTURE) IMPLANT
SUT NOVA 1 T20/GS 25DT (SUTURE) IMPLANT
SUT PDS AB 1 CT1 27 (SUTURE) IMPLANT
SUT PDS AB 1 TP1 96 (SUTURE) IMPLANT
SUT SILK 2 0 SH CR/8 (SUTURE) ×1 IMPLANT
SUT SILK 2-0 18XBRD TIE 12 (SUTURE) ×1 IMPLANT
SUT SILK 3 0 SH CR/8 (SUTURE) ×1 IMPLANT
SUT SILK 3-0 18XBRD TIE 12 (SUTURE) ×1 IMPLANT
SUT VIC AB 2-0 SH 18 (SUTURE) IMPLANT
SUT VIC AB 3-0 SH 18 (SUTURE) IMPLANT
SYR 20ML LL LF (SYRINGE) IMPLANT
TOWEL OR 17X26 10 PK STRL BLUE (TOWEL DISPOSABLE) ×2 IMPLANT
TRAY FOLEY MTR SLVR 14FR STAT (SET/KITS/TRAYS/PACK) IMPLANT

## 2023-09-06 NOTE — Transfer of Care (Signed)
 Immediate Anesthesia Transfer of Care Note  Patient: Natasha Wilkinson  Procedure(s) Performed: EXPLORATORY LAPAROTOMY, LOOP COLOSTOMY  Patient Location: PACU  Anesthesia Type:General  Level of Consciousness: awake and alert   Airway & Oxygen Therapy: Patient Spontanous Breathing and Patient connected to face mask oxygen  Post-op Assessment: Report given to RN and Post -op Vital signs reviewed and stable  Post vital signs: Reviewed and stable  Last Vitals:  Vitals Value Taken Time  BP 118/63 09/06/23 0945  Temp 37.1 C 09/06/23 0941  Pulse 72 09/06/23 0946  Resp 9 09/06/23 0946  SpO2 100 % 09/06/23 0946  Vitals shown include unfiled device data.  Last Pain:  Vitals:   09/06/23 0941  TempSrc:   PainSc: Asleep      Patients Stated Pain Goal: 3 (09/06/23 9351)  Complications: No notable events documented.

## 2023-09-06 NOTE — Anesthesia Preprocedure Evaluation (Signed)
Anesthesia Evaluation  Patient identified by MRN, date of birth, ID band Patient awake    Reviewed: Allergy & Precautions, H&P , NPO status , Patient's Chart, lab work & pertinent test results  History of Anesthesia Complications (+) PONV and history of anesthetic complications  Airway Mallampati: II  TM Distance: >3 FB Neck ROM: Full    Dental   Pulmonary neg pulmonary ROS, sleep apnea    Pulmonary exam normal        Cardiovascular Exercise Tolerance: Good hypertension, Pt. on medications and Pt. on home beta blockers negative cardio ROS  Rhythm:Regular Rate:Normal     Neuro/Psych negative neurological ROS  negative psych ROS   GI/Hepatic negative GI ROS, Neg liver ROS,,,diverticulitis   Endo/Other  negative endocrine ROS    Renal/GU negative Renal ROS  negative genitourinary   Musculoskeletal   Abdominal   Peds  Hematology negative hematology ROS (+)   Anesthesia Other Findings   Reproductive/Obstetrics negative OB ROS                              Anesthesia Physical Anesthesia Plan  ASA: 3 and emergent  Anesthesia Plan: General   Post-op Pain Management: Tylenol PO (pre-op)*, Ketamine IV* and Lidocaine infusion*   Induction: Intravenous  PONV Risk Score and Plan: 4 or greater and Dexamethasone, Ondansetron, Midazolam, Treatment may vary due to age or medical condition and Scopolamine patch - Pre-op  Airway Management Planned: Oral ETT  Additional Equipment: None  Intra-op Plan:   Post-operative Plan: Extubation in OR  Informed Consent: I have reviewed the patients History and Physical, chart, labs and discussed the procedure including the risks, benefits and alternatives for the proposed anesthesia with the patient or authorized representative who has indicated his/her understanding and acceptance.     Dental advisory given  Plan Discussed with: CRNA  Anesthesia  Plan Comments:         Anesthesia Quick Evaluation

## 2023-09-06 NOTE — Progress Notes (Signed)
   09/06/23 2306  BiPAP/CPAP/SIPAP  BiPAP/CPAP/SIPAP Pt Type Adult  BiPAP/CPAP/SIPAP DREAMSTATIOND  Mask Type Nasal mask (home mask)  Mask Size Medium  FiO2 (%) 21 %  Patient Home Equipment No (home mask/tubing)  Auto Titrate Yes

## 2023-09-06 NOTE — Plan of Care (Signed)
  Problem: Education: Goal: Understanding of discharge needs will improve Outcome: Progressing Goal: Verbalization of understanding of the causes of altered bowel function will improve Outcome: Progressing   Problem: Activity: Goal: Ability to tolerate increased activity will improve Outcome: Progressing

## 2023-09-06 NOTE — Consult Note (Signed)
 WOC consulted for preop stoma marking after WOC had left this campus yesterday.  WOC arrived after 815 this am due to personal appointment, not aware of marking until after patient was in the OR. Notified surgeon of same.   Will add to FU list for post op ostomy teaching and care.   Wyllow Seigler [facility], CNS, [employer] [phone]

## 2023-09-06 NOTE — Op Note (Signed)
 09/06/2023  9:28 AM  PATIENT:  Natasha Wilkinson  63 y.o. female  Patient Care Team: Arloa Elsie SAUNDERS, MD as PCP - General (Family Medicine) Mai Lynwood JULIANNA, MD as Consulting Physician (Rheumatology)  PRE-OPERATIVE DIAGNOSIS:  Large bowel obstruction  POST-OPERATIVE DIAGNOSIS:  Same  PROCEDURE:  Exploratory laparotomy with creation of loop sigmoid colostomy  SURGEON:  Lonni HERO. Teresa, MD  ASSISTANT: Krystal Spinner MD  ANESTHESIA:   local and general  COUNTS:  Sponge, needle and instrument counts were reported correct x2 at the conclusion of the operation.  EBL: 10 mL  DRAINS: None  SPECIMEN: None  COMPLICATIONS: None  FINDINGS: Distended colon, healthy in appearance otherwise without evident perforation. Decompressed and subsequently a loop sigmoid colostomy fashioned in left lower quadrant. No significant ascites. No purulence/abscess  DISPOSITION: PACU in satisfactory condition  DESCRIPTION: The patient was identified in preop holding and taken to the OR where she was placed on the operating room table. SCDs were placed. General endotracheal anesthesia was induced without difficulty. Pressure points were then evaluated and padded. A foley catheter was placed by nursing. She was then prepped and draped in the usual sterile fashion. A surgical timeout was performed indicating the correct patient, procedure, positioning and need for preoperative antibiotics.   We began by reopening the skin incision at her former hernia site.  Subcutaneous tissue was also able to be opened without difficulty.  The fascia is exposed.  The previously placed PDS sutures were cut and removed.  The abdomen is evaluated.  An Alexis wound protector was placed.  The colon is quite distended and enlarged but without significant volumes of pressure per se.  I suspect she had some degree of chronic underlying partial obstruction previously as well.  The wound was further opened to facilitate exposure.   The sigmoid and descending colon had previously been mobilized and were still mobile at this point.  In order to facilitate further exposure and subsequent colostomy creation, we did need to decompress the colon.  A pursestring 3-0 silk suture was placed.  A colotomy was then created at the point of planned colostomy creation.  The colon was then decompressed using a pull tip suction device.  We evacuated upwards of a liter of gas and stool.  The abdomen is much less distended now.  The pursestring suture was tied.  The colon was then inspected.  It is pink and healthy in appearance.  The cecum is evaluated.  It is pink and healthy in appearance.  The pelvis is irrigated.  Dense adhesions are noted as seen at her previous surgery.  There is no evidence of perforation or abscess.  The colon is healthy in appearance at this location.  Attention is then directed to create in the class in the left lower quadrant.  A wheal of skin is excised.  Subcutaneous tissue divided large cautery.  The fascia is then incised longitudinally.  The rectus muscle was then spread.  With a hand in place, we opened the peritoneum through this incision.  It is dilated to approximately 2-1/2 fingerbreadths in diameter which was necessary given the size of her colon.  A red rubber catheter was then placed through the planned location of the loop colostomy which corresponded to the location of her colotomy.  This red rubber was placed at the edge of the mesentery to facilitate and preventing retraction of the colon in the early postoperative period.  We were then able to deliver the loop of colon through  the created colostomy incision.  This reaches up to the level of the skin without any tension.  It is pink in color.  Orientation is confirmed such that there is no twisting of the colostomy.  The abdomen is then irrigated.  Hemostasis is verified.  The midline fascia was then closed using 2 running #1 PDS sutures.  The closure was then  palpated and noted to be complete without any gaps.  The subcutaneous tissue was then irrigated.  Local anesthetic consisting of 0.25% Marcaine  with epinephrine  and Exparel  was infiltrated as a block around the incision.  The skin is then approximated using staples.  A towel was placed over the field.  The colostomy was then matured using 3-0 Vicryl sutures.  It is widely patent.  The red rubber catheter secured to itself using a 2-0 nylon suture.  Of note, all sponge, needle, and instrument counts were reported correct x 2.  An ostomy appliance was cut to fit.  Gown/gloves were changed and a clean dressing consisting of honeycomb was placed over the incision after removing the towel.  She is then awakened from anesthesia, extubated, and transferred to a stretcher for transport to recovery in satisfactory condition.

## 2023-09-06 NOTE — Plan of Care (Signed)
  Problem: Activity: Goal: Ability to tolerate increased activity will improve Outcome: Progressing   Problem: Nutritional: Goal: Will attain and maintain optimal nutritional status will improve Outcome: Progressing   Problem: Nutrition: Goal: Adequate nutrition will be maintained Outcome: Progressing

## 2023-09-06 NOTE — Progress Notes (Signed)
 Subjective No acute events. Some flatus/liquid overnight but nothing high volume. No n/v.  Objective: Vital signs in last 24 hours: Temp:  [97.5 F (36.4 C)-97.8 F (36.6 C)] 97.8 F (36.6 C) (02/04 0520) Pulse Rate:  [70-99] 94 (02/04 0520) Resp:  [14-18] 17 (02/04 0520) BP: (108-145)/(79-93) 145/80 (02/04 0520) SpO2:  [96 %-97 %] 97 % (02/04 0520) FiO2 (%):  [21 %] 21 % (02/03 2331) Weight:  [97.4 kg] 97.4 kg (02/04 0648) Last BM Date : 09/04/23  Intake/Output from previous day: 02/03 0701 - 02/04 0700 In: 2576.3 [P.O.:720; I.V.:1452.9; IV Piggyback:403.3] Out: -  Intake/Output this shift: No intake/output data recorded.  Gen: NAD, comfortable CV: RRR Pulm: Normal work of breathing Abd: Soft, mildly distended; mildly tender around incisions. Serous drainage from drain site. Ext: SCDs in place  Lab Results: CBC  Recent Labs    09/05/23 0857 09/06/23 0515  WBC 11.2* 9.7  HGB 12.3 11.5*  HCT 36.6 34.9*  PLT 360 310   BMET Recent Labs    09/05/23 0857 09/06/23 0515  NA 129* 130*  K 3.2* 3.6  CL 93* 98  CO2 25 20*  GLUCOSE 124* 101*  BUN 14 15  CREATININE 0.54 0.55  CALCIUM 9.2 8.8*   PT/INR No results for input(s): LABPROT, INR in the last 72 hours. ABG No results for input(s): PHART, HCO3 in the last 72 hours.  Invalid input(s): PCO2, PO2  Studies/Results:  Anti-infectives: Anti-infectives (From admission, onward)    Start     Dose/Rate Route Frequency Ordered Stop   09/06/23 0700  [MAR Hold]  cefoTEtan  (CEFOTAN ) 2 g in sodium chloride  0.9 % 100 mL IVPB        (MAR Hold since Tue 09/06/2023 at 0625.Hold Reason: Transfer to a Procedural area)   2 g 200 mL/hr over 30 Minutes Intravenous  Once 09/05/23 2027     08/31/23 1400  neomycin  (MYCIFRADIN ) tablet 1,000 mg  Status:  Discontinued       Placed in And Linked Group   1,000 mg Oral 3 times per day 08/31/23 0643 08/31/23 0645   08/31/23 1400  metroNIDAZOLE  (FLAGYL ) tablet 1,000 mg   Status:  Discontinued       Placed in And Linked Group   1,000 mg Oral 3 times per day 08/31/23 9356 08/31/23 0645   08/31/23 0645  cefoTEtan  (CEFOTAN ) 2 g in sodium chloride  0.9 % 100 mL IVPB        2 g 200 mL/hr over 30 Minutes Intravenous On call to O.R. 08/31/23 0643 08/31/23 1510        Assessment/Plan: Patient Active Problem List   Diagnosis Date Noted   S/P hernia repair 08/31/2023   Lung nodule 11/20/2021   Seasonal allergies    Hypertension    Hypercholesteremia    Obesity    Lichenoid dermatitis    Diverticulitis    Hemorrhoid    s/p Procedure(s): XI ROBOTIC ASSISTED DIAGNOSTIC LAPAROSCOPY WITH EXTENSIVE LYSIS OF ADHESIONS DIAGNOSTIC FLEXIBLE SIGMOIDOSCOPY INCARCERATED INCISIONAL HERNIA REPAIR CYSTOSCOPY with FIREFLY INJECTION 08/31/2023  -Labs better - hypokalemia - resolved after yesterday  - We have discussed proceeding with laparotomy and loop colostomy as reviewed yesterday -The planned procedure, material risks (including, but not limited to, pain, bleeding, infection, scarring, need for blood transfusion, damage to surrounding structures- blood vessels/nerves/viscus/organs, damage to ureter, urine leak, leak from anastomosis if performed, need for additional procedures, worsening of pre-existing medical conditions, chronic diarrhea, constipation secondary to narcotic use, hernia, recurrence, pneumonia, heart  attack, stroke, death) benefits and alternatives to surgery were discussed at length. The patient's and her husband's questions were answered to their satisfaction, they voiced understanding and have elected to proceed with surgery. Additionally, we discussed typical postoperative expectations and the recovery process.  I spent a total of 35 minutes in both face-to-face and non-face-to-face activities, excluding procedures performed, for this visit on the date of this encounter.   LOS: 5 days   Lonni Pizza, MD [facility], [facility]  [facility]

## 2023-09-06 NOTE — Anesthesia Procedure Notes (Signed)
 Procedure Name: Intubation Date/Time: 09/06/2023 7:37 AM  Performed by: Deeann Eva BROCKS, CRNAPre-anesthesia Checklist: Patient identified, Emergency Drugs available, Suction available, Patient being monitored and Timeout performed Patient Re-evaluated:Patient Re-evaluated prior to induction Oxygen Delivery Method: Circle system utilized Preoxygenation: Pre-oxygenation with 100% oxygen Induction Type: IV induction and Cricoid Pressure applied Ventilation: Mask ventilation without difficulty Laryngoscope Size: Mac and 3 Grade View: Grade II Tube type: Oral Tube size: 7.0 mm Number of attempts: 1 Airway Equipment and Method: Stylet Placement Confirmation: ETT inserted through vocal cords under direct vision, positive ETCO2 and breath sounds checked- equal and bilateral Secured at: 21 cm Tube secured with: Tape Dental Injury: Teeth and Oropharynx as per pre-operative assessment

## 2023-09-07 ENCOUNTER — Encounter (HOSPITAL_COMMUNITY): Payer: Self-pay | Admitting: Surgery

## 2023-09-07 LAB — BASIC METABOLIC PANEL
Anion gap: 15 (ref 5–15)
BUN: 16 mg/dL (ref 8–23)
CO2: 21 mmol/L — ABNORMAL LOW (ref 22–32)
Calcium: 8.7 mg/dL — ABNORMAL LOW (ref 8.9–10.3)
Chloride: 98 mmol/L (ref 98–111)
Creatinine, Ser: 0.51 mg/dL (ref 0.44–1.00)
GFR, Estimated: >60 mL/min (ref >60)
Glucose, Bld: 97 mg/dL (ref 70–99)
Potassium: 3.2 mmol/L — ABNORMAL LOW (ref 3.5–5.1)
Sodium: 134 mmol/L — ABNORMAL LOW (ref 135–145)

## 2023-09-07 LAB — CBC WITH DIFFERENTIAL/PLATELET
Abs Immature Granulocytes: 0.11 10*3/uL — ABNORMAL HIGH (ref 0.00–0.07)
Basophils Absolute: 0 10*3/uL (ref 0.0–0.1)
Basophils Relative: 0 %
Eosinophils Absolute: 0.1 10*3/uL (ref 0.0–0.5)
Eosinophils Relative: 1 %
HCT: 35.8 % — ABNORMAL LOW (ref 36.0–46.0)
Hemoglobin: 11.5 g/dL — ABNORMAL LOW (ref 12.0–15.0)
Immature Granulocytes: 1 %
Lymphocytes Relative: 7 %
Lymphs Abs: 1 10*3/uL (ref 0.7–4.0)
MCH: 29.9 pg (ref 26.0–34.0)
MCHC: 32.1 g/dL (ref 30.0–36.0)
MCV: 93 fL (ref 80.0–100.0)
Monocytes Absolute: 1.7 10*3/uL — ABNORMAL HIGH (ref 0.1–1.0)
Monocytes Relative: 12 %
Neutro Abs: 11.1 10*3/uL — ABNORMAL HIGH (ref 1.7–7.7)
Neutrophils Relative %: 79 %
Platelets: 308 10*3/uL (ref 150–400)
RBC: 3.85 MIL/uL — ABNORMAL LOW (ref 3.87–5.11)
RDW: 14.6 % (ref 11.5–15.5)
WBC: 14.1 10*3/uL — ABNORMAL HIGH (ref 4.0–10.5)
nRBC: 0 % (ref 0.0–0.2)

## 2023-09-07 MED ORDER — POTASSIUM CHLORIDE CRYS ER 20 MEQ PO TBCR
40.0000 meq | EXTENDED_RELEASE_TABLET | Freq: Once | ORAL | Status: AC
  Administered 2023-09-07: 40 meq via ORAL
  Filled 2023-09-07: qty 2

## 2023-09-07 NOTE — Progress Notes (Signed)
  Subjective No acute events. Feeling better; no n/v. Tolerating clear liquids. Sore at incision but pain controlled. Distention improved. Ostomy output has been occurring well - some leaks with watery consistency stool overnight.   Objective: Vital signs in last 24 hours: Temp:  [97.5 F (36.4 C)-99.4 F (37.4 C)] 97.8 F (36.6 C) (02/05 0512) Pulse Rate:  [62-90] 62 (02/05 0512) Resp:  [10-20] 18 (02/05 0512) BP: (96-132)/(50-79) 96/50 (02/05 0512) SpO2:  [93 %-100 %] 99 % (02/05 0512) FiO2 (%):  [21 %] 21 % (02/04 2306) Last BM Date : 09/04/23  Intake/Output from previous day: 02/04 0701 - 02/05 0700 In: 2509.9 [P.O.:1060; I.V.:1099.9; IV Piggyback:350] Out: 1355 [Urine:675; Stool:650; Blood:30] Intake/Output this shift: No intake/output data recorded.  Gen: NAD, comfortable CV: RRR Pulm: Normal work of breathing Abd: Soft, mild incisional tenderness, not significantly distended; no rebound/guarding Ext: SCDs in place  Lab Results: CBC  Recent Labs    09/06/23 0515 09/07/23 0457  WBC 9.7 14.1*  HGB 11.5* 11.5*  HCT 34.9* 35.8*  PLT 310 308   BMET Recent Labs    09/06/23 0515 09/07/23 0457  NA 130* 134*  K 3.6 3.2*  CL 98 98  CO2 20* 21*  GLUCOSE 101* 97  BUN 15 16  CREATININE 0.55 0.51  CALCIUM 8.8* 8.7*   PT/INR No results for input(s): LABPROT, INR in the last 72 hours. ABG No results for input(s): PHART, HCO3 in the last 72 hours.  Invalid input(s): PCO2, PO2  Studies/Results:  Anti-infectives: Anti-infectives (From admission, onward)    Start     Dose/Rate Route Frequency Ordered Stop   09/06/23 0700  cefoTEtan  (CEFOTAN ) 2 g in sodium chloride  0.9 % 100 mL IVPB        2 g 200 mL/hr over 30 Minutes Intravenous  Once 09/05/23 2027 09/06/23 0725   08/31/23 1400  neomycin  (MYCIFRADIN ) tablet 1,000 mg  Status:  Discontinued       Placed in And Linked Group   1,000 mg Oral 3 times per day 08/31/23 0643 08/31/23 0645   08/31/23  1400  metroNIDAZOLE  (FLAGYL ) tablet 1,000 mg  Status:  Discontinued       Placed in And Linked Group   1,000 mg Oral 3 times per day 08/31/23 9356 08/31/23 0645   08/31/23 0645  cefoTEtan  (CEFOTAN ) 2 g in sodium chloride  0.9 % 100 mL IVPB        2 g 200 mL/hr over 30 Minutes Intravenous On call to O.R. 08/31/23 0643 08/31/23 1510        Assessment/Plan: Patient Active Problem List   Diagnosis Date Noted   S/P hernia repair 08/31/2023   Lung nodule 11/20/2021   Seasonal allergies    Hypertension    Hypercholesteremia    Obesity    Lichenoid dermatitis    Diverticulitis    Hemorrhoid    POD#7 - XI ROBOTIC ASSISTED DIAGNOSTIC LAPAROSCOPY WITH EXTENSIVE LYSIS OF ADHESIONS DIAGNOSTIC FLEXIBLE SIGMOIDOSCOPY INCARCERATED INCISIONAL HERNIA REPAIR CYSTOSCOPY with FIREFLY INJECTION   POD#1 - EXPLORATORY LAPAROTOMY, LOOP COLOSTOMY 09/06/2023  - Advance to full liquids; ADAT - anticipate as stool thickens ostomy appliciance will be better managed. Continue stoma rod today  - WOCN to see to begin teaching. Outpatient WOCN clinic referral placed as well - Hypokalemia - replace with 40 mEq PO today - Ambulate 5x/day as able - PPX: SQH, SCDs   LOS: 7 days   Lonni Pizza, MD [facility], [facility]

## 2023-09-07 NOTE — Anesthesia Postprocedure Evaluation (Signed)
 Anesthesia Post Note  Patient: Natasha Wilkinson  Procedure(s) Performed: EXPLORATORY LAPAROTOMY, LOOP COLOSTOMY     Patient location during evaluation: PACU Anesthesia Type: General Level of consciousness: awake and alert Pain management: pain level controlled Vital Signs Assessment: post-procedure vital signs reviewed and stable Respiratory status: spontaneous breathing, nonlabored ventilation, respiratory function stable and patient connected to nasal cannula oxygen Cardiovascular status: blood pressure returned to baseline and stable Postop Assessment: no apparent nausea or vomiting Anesthetic complications: no   No notable events documented.  Last Vitals:  Vitals:   09/07/23 0512 09/07/23 1430  BP: (!) 96/50 (!) 98/56  Pulse: 62 71  Resp: 18 18  Temp: 36.6 C 36.6 C  SpO2: 99% 99%    Last Pain:  Vitals:   09/07/23 1658  TempSrc:   PainSc: Asleep                 Shelsie Tijerino

## 2023-09-07 NOTE — Plan of Care (Signed)
  Problem: Education: Goal: Understanding of discharge needs will improve Outcome: Progressing Goal: Verbalization of understanding of the causes of altered bowel function will improve Outcome: Progressing   Problem: Activity: Goal: Ability to tolerate increased activity will improve Outcome: Progressing   Problem: Bowel/Gastric: Goal: Gastrointestinal status for postoperative course will improve Outcome: Progressing   Problem: Health Behavior/Discharge Planning: Goal: Identification of community resources to assist with postoperative recovery needs will improve Outcome: Progressing

## 2023-09-07 NOTE — Consult Note (Signed)
 WOC Nurse ostomy consult note Stoma type/location: LLQ colostomy, red rubber retention bridge in place, stoma flush, nearly recessed.  Photo in chart.  Stomal assessment/size: 1 with retention in place, making it larger than the stoma.   Peristomal assessment: creasing at 3 and 9 o'clock, recessed stoma with retention bridge in place.  Treatment options for stomal/peristomal skin: Barrier ring piece at 3 and 9 o'clock and around stoma/retention.  1 piece convex pouch .  Added ostomy belt today for added support.  Gauze placed over staple line for padding/reduce irritation.  Output liquid brown stool Ostomy pouching: 1pc. Convex  Education provided: We perform pouch change for patient and family.  I explain rationale for retention bridge, leaking appliance (peristomal creasing) (abdominal edema)  We measure stoma and cut barrier to fit.  I demonstrate barrier ring in creasing and then around stoma and they take a photo to remind themselves We cut and apply pouch. I apply stoma belt and explain rationale to hold pouch securely.  She understands she can remove if needed.  We discuss twice weekly pouch changes, showering with pouch.  Emptying when 1/3 full and demonstrated emptying.  Emotional support provided, patient is accepting of ostomy as necessary and wants to learn to manage independently.   Enrolled patient in [employer] DC program: NO Will follow.  Darice Cooley MSN, RN, FNP-BC CWON Wound, Ostomy, Continence Nurse [facility] [phone] Pager [phone]

## 2023-09-08 LAB — CBC WITH DIFFERENTIAL/PLATELET
Abs Immature Granulocytes: 0.11 10*3/uL — ABNORMAL HIGH (ref 0.00–0.07)
Basophils Absolute: 0 10*3/uL (ref 0.0–0.1)
Basophils Relative: 0 %
Eosinophils Absolute: 0.5 10*3/uL (ref 0.0–0.5)
Eosinophils Relative: 5 %
HCT: 28 % — ABNORMAL LOW (ref 36.0–46.0)
Hemoglobin: 9.3 g/dL — ABNORMAL LOW (ref 12.0–15.0)
Immature Granulocytes: 1 %
Lymphocytes Relative: 8 %
Lymphs Abs: 0.8 10*3/uL (ref 0.7–4.0)
MCH: 30 pg (ref 26.0–34.0)
MCHC: 33.2 g/dL (ref 30.0–36.0)
MCV: 90.3 fL (ref 80.0–100.0)
Monocytes Absolute: 1.1 10*3/uL — ABNORMAL HIGH (ref 0.1–1.0)
Monocytes Relative: 12 %
Neutro Abs: 7.1 10*3/uL (ref 1.7–7.7)
Neutrophils Relative %: 74 %
Platelets: 246 10*3/uL (ref 150–400)
RBC: 3.1 MIL/uL — ABNORMAL LOW (ref 3.87–5.11)
RDW: 14.6 % (ref 11.5–15.5)
WBC: 9.6 10*3/uL (ref 4.0–10.5)
nRBC: 0 % (ref 0.0–0.2)

## 2023-09-08 LAB — BASIC METABOLIC PANEL
Anion gap: 12 (ref 5–15)
BUN: 14 mg/dL (ref 8–23)
CO2: 24 mmol/L (ref 22–32)
Calcium: 8.5 mg/dL — ABNORMAL LOW (ref 8.9–10.3)
Chloride: 94 mmol/L — ABNORMAL LOW (ref 98–111)
Creatinine, Ser: 0.58 mg/dL (ref 0.44–1.00)
GFR, Estimated: >60 mL/min (ref >60)
Glucose, Bld: 99 mg/dL (ref 70–99)
Potassium: 2.9 mmol/L — ABNORMAL LOW (ref 3.5–5.1)
Sodium: 130 mmol/L — ABNORMAL LOW (ref 135–145)

## 2023-09-08 MED ORDER — POTASSIUM CHLORIDE CRYS ER 10 MEQ PO TBCR
10.0000 meq | EXTENDED_RELEASE_TABLET | Freq: Once | ORAL | Status: AC
  Administered 2023-09-08: 10 meq via ORAL
  Filled 2023-09-08: qty 1

## 2023-09-08 MED ORDER — POTASSIUM CHLORIDE 10 MEQ/100ML IV SOLN
10.0000 meq | INTRAVENOUS | Status: DC
  Administered 2023-09-08 (×2): 10 meq via INTRAVENOUS
  Filled 2023-09-08 (×3): qty 100

## 2023-09-08 MED ORDER — POTASSIUM CHLORIDE 10 MEQ/100ML IV SOLN
10.0000 meq | INTRAVENOUS | Status: AC
  Administered 2023-09-08: 10 meq via INTRAVENOUS

## 2023-09-08 MED ORDER — POTASSIUM CHLORIDE CRYS ER 20 MEQ PO TBCR
20.0000 meq | EXTENDED_RELEASE_TABLET | Freq: Once | ORAL | Status: AC
  Administered 2023-09-08: 20 meq via ORAL
  Filled 2023-09-08: qty 1

## 2023-09-08 NOTE — Progress Notes (Signed)
 Pt self administered CPAP when ready for bed.

## 2023-09-08 NOTE — Progress Notes (Signed)
   09/08/23 2349  BiPAP/CPAP/SIPAP  BiPAP/CPAP/SIPAP Pt Type Adult  BiPAP/CPAP/SIPAP DREAMSTATIOND  Mask Type Nasal mask  Mask Size Medium  Respiratory Rate 18 breaths/min  FiO2 (%) 21 %  Patient Home Equipment No  Auto Titrate Yes

## 2023-09-08 NOTE — Consult Note (Signed)
 WOC Nurse ostomy follow up Stoma type/location: LLQ end colostomy, recessed with retention bridge (red rubber) in place.  Leaked at 4 AM this morning when patient got up to go to bathroom.  Staff replaced pouch with a different style pouch and it has leaked a couple times.   Stomal assessment/size: oval 1 with red rubber retention Peristomal assessment:  intact but tender  added stoma powder and skin prep Barrier ring to creasing at 3 and 9 o'clock, pulled into strips.  Additional barrier ring around stoma, molded around retention Needs another ostomy belt.  Unsure where belt is today.  Treatment options for stomal/peristomal skin: stoma powder and skin prep  2 barrier rings and 1 piece convex pouch with belt Output liquid brown stool Ostomy pouching: 1pc. Barrier ring and belt Education provided:  new pouch change performed.  Written materials provided.  Enrolled patient in [employer] Secure Start Discharge program: Yes will today.  Will follow.  Darice Cooley MSN, RN, FNP-BC CWON Wound, Ostomy, Continence Nurse [facility] [phone] Pager [phone]

## 2023-09-08 NOTE — Plan of Care (Signed)
  Problem: Activity: Goal: Risk for activity intolerance will decrease Outcome: Completed/Met   Problem: Nutrition: Goal: Adequate nutrition will be maintained Outcome: Completed/Met   

## 2023-09-08 NOTE — Progress Notes (Signed)
  Subjective No acute events. Feeling better; no n/v. Tolerating liquids. Sore at incision but pain controlled. Distention improved. Ostomy output has continued well.  Objective: Vital signs in last 24 hours: Temp:  [97.8 F (36.6 C)-99 F (37.2 C)] 99 F (37.2 C) (02/06 0508) Pulse Rate:  [71-88] 71 (02/06 0508) Resp:  [18-20] 18 (02/06 0508) BP: (98-109)/(53-56) 100/53 (02/06 0508) SpO2:  [96 %-99 %] 96 % (02/06 0508) Last BM Date : 09/04/23  Intake/Output from previous day: 02/05 0701 - 02/06 0700 In: 1480 [P.O.:1480] Out: 1900 [Urine:1100; Stool:800] Intake/Output this shift: No intake/output data recorded.  Gen: NAD, comfortable CV: RRR Pulm: Normal work of breathing Abd: Soft, mild incisional tenderness, not significantly distended; no rebound/guarding Ext: SCDs in place  Lab Results: CBC  Recent Labs    09/07/23 0457 09/08/23 0523  WBC 14.1* 9.6  HGB 11.5* 9.3*  HCT 35.8* 28.0*  PLT 308 246   BMET Recent Labs    09/07/23 0457 09/08/23 0523  NA 134* 130*  K 3.2* 2.9*  CL 98 94*  CO2 21* 24  GLUCOSE 97 99  BUN 16 14  CREATININE 0.51 0.58  CALCIUM 8.7* 8.5*   PT/INR No results for input(s): LABPROT, INR in the last 72 hours. ABG No results for input(s): PHART, HCO3 in the last 72 hours.  Invalid input(s): PCO2, PO2  Studies/Results:  Anti-infectives: Anti-infectives (From admission, onward)    Start     Dose/Rate Route Frequency Ordered Stop   09/06/23 0700  cefoTEtan  (CEFOTAN ) 2 g in sodium chloride  0.9 % 100 mL IVPB        2 g 200 mL/hr over 30 Minutes Intravenous  Once 09/05/23 2027 09/06/23 0725   08/31/23 1400  neomycin  (MYCIFRADIN ) tablet 1,000 mg  Status:  Discontinued       Placed in And Linked Group   1,000 mg Oral 3 times per day 08/31/23 9356 08/31/23 0645   08/31/23 1400  metroNIDAZOLE  (FLAGYL ) tablet 1,000 mg  Status:  Discontinued       Placed in And Linked Group   1,000 mg Oral 3 times per day 08/31/23 9356  08/31/23 0645   08/31/23 0645  cefoTEtan  (CEFOTAN ) 2 g in sodium chloride  0.9 % 100 mL IVPB        2 g 200 mL/hr over 30 Minutes Intravenous On call to O.R. 08/31/23 0643 08/31/23 1510        Assessment/Plan: Patient Active Problem List   Diagnosis Date Noted   S/P hernia repair 08/31/2023   Lung nodule 11/20/2021   Seasonal allergies    Hypertension    Hypercholesteremia    Obesity    Lichenoid dermatitis    Diverticulitis    Hemorrhoid    POD#8 - XI ROBOTIC ASSISTED DIAGNOSTIC LAPAROSCOPY WITH EXTENSIVE LYSIS OF ADHESIONS DIAGNOSTIC FLEXIBLE SIGMOIDOSCOPY INCARCERATED INCISIONAL HERNIA REPAIR CYSTOSCOPY with FIREFLY INJECTION   POD#2 - EXPLORATORY LAPAROTOMY, LOOP COLOSTOMY 09/06/2023  - Soft diet as tolerated. Continue stoma rod today - will likely plan to remove tomorrow  - WOCN following. Outpatient WOCN clinic referral placed as well - Hypokalemia - replace with potassium today. Ok for PO now given her toleration of PO - reviewed with her nurse Cristy. - Ambulate 5x/day as able - PPX: SQH, SCDs   LOS: 8 days   Lonni Pizza, MD [facility], [facility]

## 2023-09-08 NOTE — Plan of Care (Signed)
  Problem: Activity: Goal: Ability to tolerate increased activity will improve Outcome: Progressing   Problem: Bowel/Gastric: Goal: Gastrointestinal status for postoperative course will improve Outcome: Progressing   Problem: Health Behavior/Discharge Planning: Goal: Identification of community resources to assist with postoperative recovery needs will improve Outcome: Progressing

## 2023-09-09 NOTE — Consult Note (Signed)
 WOC back by to see patient, supplies taken to the room including soft convex, barrier rings and extra belt.  Patient aware Natasha Wilkinson will remove rod Monday at OP ostomy appt.    HHRN arranged by [facility]  Edith Groleau Fabian Holster MSN,RN,CWOCN, CNS, CWON-AP [phone]

## 2023-09-09 NOTE — Consult Note (Signed)
 WOC to the floor, notified by bedside nursing pouch changed at 6am.  Dr. Teresa requested rod to be removed, however pouch intact.  Will plan for clinic visit Monday with WOC NP to remove rod and transition patients pouching if needed.  Supplies ordered for DC to home; plans to have [facility].  1pc soft convex, barrier rings, belt.   Outpatient clinic aware of change to appt; Monday 2/10 @ 1pm   Travious Vanover [facility], CNS, [employer] [phone]

## 2023-09-09 NOTE — TOC Transition Note (Signed)
 Transition of Care [facility]) - Discharge Note   Patient Details  Name: Natasha Wilkinson MRN: [id number] Date of Birth: 10-21-1960  Transition [facility]) CM/SW Contact:  Sonda Manuella Quill, RN Phone Number: 09/09/2023, 1:08 PM   Clinical Narrative:    D/C orders received; [facility] arranged w/ [facility]; no TOC needs.   Final next level of care: Home w Home Health Services Barriers to Discharge: No Barriers Identified   Patient Goals and CMS Choice            Discharge Placement                       Discharge Plan and Services Additional resources added to the After Visit Summary for                  DME Arranged: N/A DME Agency: NA       HH Arranged: RN HH Agency: [facility] Date [facility] Agency Contacted: 09/09/23 Time HH Agency Contacted: [phone] Representative spoke with at [facility] Agency: Darleene  Social Drivers of Health (SDOH) Interventions SDOH Screenings   Food Insecurity: No Food Insecurity (08/31/2023)  Housing: Low Risk  (08/31/2023)  Transportation Needs: No Transportation Needs (08/31/2023)  Utilities: Not At Risk (08/31/2023)  Tobacco Use: Low Risk  (09/06/2023)     Readmission Risk Interventions    09/01/2023    9:48 AM  Readmission Risk Prevention Plan  Post Dischage Appt Complete  Medication Screening Complete  Transportation Screening Complete

## 2023-09-09 NOTE — Progress Notes (Signed)
 Discharge instructions given to patient and all questions were answered.

## 2023-09-09 NOTE — Plan of Care (Signed)
  Problem: Education: Goal: Understanding of discharge needs will improve Outcome: Progressing   Problem: Nutritional: Goal: Will attain and maintain optimal nutritional status will improve Outcome: Progressing

## 2023-09-09 NOTE — Plan of Care (Signed)
   Problem: Education: Goal: Understanding of discharge needs will improve Outcome: Progressing   Problem: Activity: Goal: Ability to tolerate increased activity will improve Outcome: Progressing

## 2023-09-09 NOTE — TOC Progression Note (Signed)
 Transition of Care [facility]) - Progression Note    Patient Details  Name: Natasha Wilkinson MRN: [id number] Date of Birth: Jul 31, 1961  [facility]) CM/SW Contact  Sonda Manuella Quill, RN Phone Number: 09/09/2023, 12:49 PM  Clinical Narrative:    Orders received for [facility]; spoke w/ pt in room; she agrees to recc service; pt says she does not have an agency preference; spoke w/ Darleene at [facility]; he says agency can provide service; he was also notified pt has appt w/ ostomy clinic 09/12/23 at 1300; pt notified; agency contact information placed in follow up provider section of d/c instructions.        Expected Discharge Plan and Services                                               Social Determinants of Health (SDOH) Interventions SDOH Screenings   Food Insecurity: No Food Insecurity (08/31/2023)  Housing: Low Risk  (08/31/2023)  Transportation Needs: No Transportation Needs (08/31/2023)  Utilities: Not At Risk (08/31/2023)  Tobacco Use: Low Risk  (09/06/2023)    Readmission Risk Interventions    09/01/2023    9:48 AM  Readmission Risk Prevention Plan  Post Dischage Appt Complete  Medication Screening Complete  Transportation Screening Complete

## 2023-09-10 NOTE — Discharge Summary (Signed)
 Patient ID: Natasha Wilkinson MRN: [id number] DOB/AGE: 1961/05/29 63 y.o.  Admit date: 08/31/2023 Discharge date: 09/10/2023  Discharge Diagnoses Patient Active Problem List   Diagnosis Date Noted   S/P hernia repair 08/31/2023   Lung nodule 11/20/2021   Seasonal allergies    Hypertension    Hypercholesteremia    Obesity    Lichenoid dermatitis    Diverticulitis    Hemorrhoid     Consultants None  Procedures Robotic assisted repair of incarcerated incisional hernia 5 x 5 cm Robotic assisted lysis of adhesions x 120 minutes, extensive Diagnostic flexible sigmoidoscopy Bilateral transversus abdominus plane (TAP) blocks  FINDINGS: Bowel containing incarcerated incisional hernia which also measured 5 x 5 cm and was Swiss cheese type defect.  Adhesions related prior surgery but additionally, dense pelvic adhesions from prior surgery for endometriosis.  Ultimately, the mid sigmoid colon was found to be densely adherent over the left iliac artery with the left ureter immediately medial and inferior.  We were able to straighten out her sigmoid colon by releasing some of her pelvic adhesions.  Endoscopically, there is no intraluminal mass or other concerning findings.  There is also no evident stricture or narrowing.  Given these constellation of findings, we opted to not attempt resection of the sigmoid colon given the increased risk for injury to both the iliac artery and left ureter which are intimately associated.  Incisional hernia was primarily closed without mesh given the nature of the procedure and preoperative discussions.    OR 09/06/23 - exploratory laparotomy with creation of loop sigmoid colostomy   FINDINGS: Distended colon, healthy in appearance otherwise without evident perforation. Decompressed and subsequently a loop sigmoid colostomy fashioned in left lower quadrant. No significant ascites. No purulence/abscess    Hospital Course: She was admitted postoperatively and  initially begin recovering well.  She did develop progressive abdominal distention and subsequently underwent CT scan which demonstrated significant swelling at the level of her rectosigmoid colon suspected to be related to postoperative edema from the degree of scarring and instrumentation at this location.  Given these findings, we discussed things with her and her husband at length.  We opted to proceed with taking her back to the operating room 09/06/2023 for a planned diverting loop colostomy.  Significant bowel dilation proximal to this rectosigmoid colon was appreciated but successfully decompressed.  Colon was otherwise healthy and normal in appearance.  She was admitted postoperatively and recovered well from this.  She began having spontaneous bowel function.  Her diet was sequentially advanced which she tolerated.  She underwent care and teaching with our wound ostomy team as well.  Home health was subsequently arranged for assistance in managing the transition to home.  On 09/09/2023, she is comfortable with and stable for discharge home with home health.  Postoperative follow-up in our office as well as with her wound ostomy team has been arranged.  Postoperative expectations have been reviewed.  All of her and her husband's questions were answered, they expressed understanding, and agreement with the plan.  She had ostomy appointment arranged for Monday with our wound ostomy team as an outpatient with plans to remove the ostomy red rubber stoma rod at that time.    Allergies as of 09/09/2023       Reactions   Peanut-containing Drug Products Anaphylaxis, Itching, Swelling   *Walnuts and Peacan's-not peanuts* Swelling of throat    Ciprofloxacin     rash   Hydrocodone -acetaminophen     Losartan Rash   Norvasc [amlodipine Besylate]  Rash   Sulfa Antibiotics Rash        Medication List     TAKE these medications    acetaminophen  500 MG tablet Commonly known as: TYLENOL  Take 1,000 mg by  mouth every 6 (six) hours as needed (pain.).   CULTURELLE PROBIOTICS PO Take 1 capsule by mouth at bedtime.   docusate sodium 100 MG capsule Commonly known as: COLACE Take 100 mg by mouth at bedtime.   ibuprofen  200 MG tablet Commonly known as: ADVIL  Take 400 mg by mouth every 8 (eight) hours as needed (pain.).   lisinopril -hydrochlorothiazide  10-12.5 MG tablet Commonly known as: ZESTORETIC  Take 1 tablet by mouth at bedtime.   loratadine  10 MG tablet Commonly known as: CLARITIN  Take 10 mg by mouth at bedtime.   Magnesium  400 MG Tabs Take 400 mg by mouth at bedtime.   metoprolol  succinate 50 MG 24 hr tablet Commonly known as: TOPROL -XL Take 50 mg by mouth at bedtime.   multivitamin with minerals Tabs tablet Take 1 tablet by mouth at bedtime. One A Day for Women   Vitamin D3 50 MCG (2000 UT) Tabs Take 2,000 Units by mouth at bedtime.       ASK your doctor about these medications    traMADol  50 MG tablet Commonly known as: Ultram  Take 1 tablet (50 mg total) by mouth every 6 (six) hours as needed for up to 5 days (postop pain not contolled with tylenol /ibuprofen  first). Ask about: Should I take this medication?          Follow-up Information     Teresa Lonni HERO, MD Follow up on 09/19/2023.   Specialties: General Surgery, Colon and Rectal Surgery Why: Please arrive by 1:10 pm Contact information: [phone] [place] [address] [phone]         Care, [facility] Follow up.   Specialty: Home Health Services Contact information: [address] [place] [address] [phone]                 Lonni HERO. Teresa, M.D. [facility]

## 2023-09-12 ENCOUNTER — Ambulatory Visit (HOSPITAL_COMMUNITY)
Admit: 2023-09-12 | Discharge: 2023-09-12 | Disposition: A | Discharge status: Home / Self Care | Payer: 59 | Attending: Nurse Practitioner | Admitting: Nurse Practitioner

## 2023-09-12 DIAGNOSIS — K9409 Other complications of colostomy: Secondary | ICD-10-CM | POA: Diagnosis not present

## 2023-09-12 DIAGNOSIS — L24B3 Irritant contact dermatitis related to fecal or urinary stoma or fistula: Secondary | ICD-10-CM

## 2023-09-12 DIAGNOSIS — K9400 Colostomy complication, unspecified: Secondary | ICD-10-CM | POA: Diagnosis not present

## 2023-09-12 DIAGNOSIS — Z933 Colostomy status: Secondary | ICD-10-CM | POA: Diagnosis present

## 2023-09-12 DIAGNOSIS — Y832 Surgical operation with anastomosis, bypass or graft as the cause of abnormal reaction of the patient, or of later complication, without mention of misadventure at the time of the procedure: Secondary | ICD-10-CM | POA: Diagnosis not present

## 2023-09-12 DIAGNOSIS — Y738 Miscellaneous gastroenterology and urology devices associated with adverse incidents, not elsewhere classified: Secondary | ICD-10-CM | POA: Diagnosis not present

## 2023-09-12 NOTE — Discharge Instructions (Signed)
 Will reach out to [facility] regarding recessed stoma Appointment for Friday to reassess stoma Stoma powder and skin prep to peristomal irritation.  Initiated coloplast flexible convex pouch with belt.

## 2023-09-12 NOTE — Progress Notes (Signed)
[facility]   Reason for visit:  LLQ recessed loop colostomy.  Retention rod remains in place. HPI:  Lysis of adhesions, incarcerated hernia repair, diverticulitis with loop colostomy Past Medical History:  Diagnosis Date   Diverticulitis    Fistula    Hemorrhoid    Hypercholesteremia    Hypertension    Lichenoid dermatitis    Obesity    PONV (postoperative nausea and vomiting)    Seasonal allergies    Sleep apnea    cpap   Family History  Problem Relation Age of Onset   Colon polyps Father    Prostate cancer Father    Aortic aneurysm Mother    Hypertension Mother    Heart disease Mother    Colon polyps Sister    Allergies  Allergen Reactions   Peanut-Containing Drug Products Anaphylaxis, Itching and Swelling    *Walnuts and Peacan's-not peanuts* Swelling of throat    Ciprofloxacin     rash   Hydrocodone-Acetaminophen    Losartan Rash   Norvasc [Amlodipine Besylate] Rash   Sulfa Antibiotics Rash   Current Outpatient Medications  Medication Sig Dispense Refill Last Dose/Taking   acetaminophen (TYLENOL) 500 MG tablet Take 1,000 mg by mouth every 6 (six) hours as needed (pain.).      Cholecalciferol (VITAMIN D3) 50 MCG (2000 UT) TABS Take 2,000 Units by mouth at bedtime.      docusate sodium (COLACE) 100 MG capsule Take 100 mg by mouth at bedtime.      ibuprofen (ADVIL) 200 MG tablet Take 400 mg by mouth every 8 (eight) hours as needed (pain.).      lisinopril-hydrochlorothiazide (ZESTORETIC) 10-12.5 MG tablet Take 1 tablet by mouth at bedtime.  0    loratadine (CLARITIN) 10 MG tablet Take 10 mg by mouth at bedtime.      Magnesium 400 MG TABS Take 400 mg by mouth at bedtime.      metoprolol succinate (TOPROL-XL) 50 MG 24 hr tablet Take 50 mg by mouth at bedtime.  0    Multiple Vitamin (MULTIVITAMIN WITH MINERALS) TABS tablet Take 1 tablet by mouth at bedtime. One A Day for Women      Probiotic Product (CULTURELLE PROBIOTICS PO) Take 1 capsule by mouth at  bedtime.      No current facility-administered medications for this encounter.   ROS  Review of Systems  Constitutional:  Positive for fatigue.       Uses wheelchair in hospital No fever, abdominal pain, nausea or vomiting  Gastrointestinal:        Recessed colostomy with retention rod in place.  Will not remove retention today.  Photo in chart.  Midline staple line, intact Rectal discharge, scant mucus.   Musculoskeletal:  Positive for gait problem.       Fatigue  Skin:  Positive for rash and wound.       Midline staple line  Psychiatric/Behavioral: Negative.    All other systems reviewed and are negative.  Vital signs:  BP 133/80 (BP Location: Right Arm)   Pulse 81   Temp 98 F (36.7 C) (Oral)   Resp 17   SpO2 99%  Exam:  Physical Exam Vitals reviewed.  Constitutional:      Appearance: Normal appearance. She is obese.  HENT:     Mouth/Throat:     Mouth: Mucous membranes are moist.  Abdominal:     Palpations: Abdomen is soft.     Comments: Colostomy Midline staple line, intact  Skin:  General: Skin is warm and dry.     Findings: Erythema present.  Neurological:     General: No focal deficit present.     Mental Status: She is alert and oriented to person, place, and time.  Psychiatric:        Mood and Affect: Mood normal.        Behavior: Behavior normal.     Stoma type/location:  LLQ colostomy, recessed below skin level. Retention rod remains in place.  Photo in chart  Stomal assessment/size:  1 1/2" slightly oval.  New pattern made and sent home with patient and spouse Peristomal assessment:  Circumferential erythema, stool was sitting on skin on pouch.   Treatment options for stomal/peristomal skin: stoma powder and skin prep.  Demonstrated this for spouse, he had not been doing that.   Output: soft brown stool, recessed stoma is pink and moist and productive of stool.  Ostomy pouching: 1pc.convex  she has not found the hollister pouch to be comfortable and  continuing to experience leaks. We will switch to coloplast flexible convex with barrier ring.   Added ostomy belt.  Education provided:  performed pouch change.  Discussed stomal retraction and that I will keep retention in place for now. I will contact surgery team to notify.      Impression/dx  Irritant contact dermatitis Colostomy complication Discussion  See back Friday. 10 AM Plan  BAck in office Friday.      Visit time: 55 minutes.   Mike Gip FNP-BC

## 2023-09-13 ENCOUNTER — Other Ambulatory Visit (HOSPITAL_COMMUNITY): Payer: Self-pay | Admitting: Nurse Practitioner

## 2023-09-13 ENCOUNTER — Ambulatory Visit (HOSPITAL_COMMUNITY): Payer: 59 | Admitting: Nurse Practitioner

## 2023-09-16 ENCOUNTER — Other Ambulatory Visit (HOSPITAL_COMMUNITY): Payer: Self-pay | Admitting: Nurse Practitioner

## 2023-09-16 ENCOUNTER — Ambulatory Visit (HOSPITAL_COMMUNITY)
Admission: RE | Admit: 2023-09-16 | Discharge: 2023-09-16 | Disposition: A | Discharge status: Home / Self Care | Payer: 59 | Source: Ambulatory Visit | Attending: Nurse Practitioner | Admitting: Nurse Practitioner

## 2023-09-16 DIAGNOSIS — K9400 Colostomy complication, unspecified: Secondary | ICD-10-CM

## 2023-09-16 DIAGNOSIS — K9409 Other complications of colostomy: Secondary | ICD-10-CM | POA: Insufficient documentation

## 2023-09-16 DIAGNOSIS — L24B3 Irritant contact dermatitis related to fecal or urinary stoma or fistula: Secondary | ICD-10-CM | POA: Insufficient documentation

## 2023-09-16 NOTE — Progress Notes (Signed)
 [facility]   Reason for visit:  LLQ recessed loop colostomy  Retention in place.  HPI:  Diverticulitis with rupture, lysis of adhesions and end colostomy Past Medical History:  Diagnosis Date   Diverticulitis    Fistula    Hemorrhoid    Hypercholesteremia    Hypertension    Lichenoid dermatitis    Obesity    PONV (postoperative nausea and vomiting)    Seasonal allergies    Sleep apnea    cpap   Family History  Problem Relation Age of Onset   Colon polyps Father    Prostate cancer Father    Aortic aneurysm Mother    Hypertension Mother    Heart disease Mother    Colon polyps Sister    Allergies  Allergen Reactions   Peanut-Containing Drug Products Anaphylaxis, Itching and Swelling    *Walnuts and Peacan's-not peanuts* Swelling of throat    Ciprofloxacin     rash   Hydrocodone-Acetaminophen    Losartan Rash   Norvasc [Amlodipine Besylate] Rash   Sulfa Antibiotics Rash   Current Outpatient Medications  Medication Sig Dispense Refill Last Dose/Taking   acetaminophen (TYLENOL) 500 MG tablet Take 1,000 mg by mouth every 6 (six) hours as needed (pain.).      Cholecalciferol (VITAMIN D3) 50 MCG (2000 UT) TABS Take 2,000 Units by mouth at bedtime.      docusate sodium (COLACE) 100 MG capsule Take 100 mg by mouth at bedtime.      ibuprofen (ADVIL) 200 MG tablet Take 400 mg by mouth every 8 (eight) hours as needed (pain.).      lisinopril-hydrochlorothiazide (ZESTORETIC) 10-12.5 MG tablet Take 1 tablet by mouth at bedtime.  0    loratadine (CLARITIN) 10 MG tablet Take 10 mg by mouth at bedtime.      Magnesium 400 MG TABS Take 400 mg by mouth at bedtime.      metoprolol succinate (TOPROL-XL) 50 MG 24 hr tablet Take 50 mg by mouth at bedtime.  0    Multiple Vitamin (MULTIVITAMIN WITH MINERALS) TABS tablet Take 1 tablet by mouth at bedtime. One A Day for Women      Probiotic Product (CULTURELLE PROBIOTICS PO) Take 1 capsule by mouth at bedtime.      No current  facility-administered medications for this encounter.   ROS  Review of Systems  Constitutional:  Positive for fatigue.  Gastrointestinal:        LLQ colostomy with recessed stoma  Skin:  Positive for color change and rash.       Peristomal irritation  Neurological: Negative.   Psychiatric/Behavioral: Negative.    All other systems reviewed and are negative.  Vital signs:  BP 122/85 (BP Location: Right Arm)   Pulse 76   Temp 98.9 F (37.2 C) (Oral)   Resp 17   SpO2 99%  Exam:  Physical Exam Vitals reviewed.  Constitutional:      Appearance: Normal appearance.  Cardiovascular:     Rate and Rhythm: Normal rate and regular rhythm.     Pulses: Normal pulses.     Heart sounds: Normal heart sounds.  Pulmonary:     Effort: Pulmonary effort is normal.     Breath sounds: Normal breath sounds.  Abdominal:     Palpations: Abdomen is soft.  Musculoskeletal:        General: Normal range of motion.  Skin:    General: Skin is warm and dry.     Findings: Erythema present.  Neurological:     Mental Status: She is alert and oriented to person, place, and time. Mental status is at baseline.  Psychiatric:        Mood and Affect: Mood normal.        Behavior: Behavior normal.     Stoma type/location:  LLQ colostomy, recessed below skin level Stomal assessment/size:  1 " but visualization is difficult due to retention and recession of stoma below skin level.  Peristomal assessment:  erythema, tenderness Treatment options for stomal/peristomal skin: 1 piece convex  could not tolerate the belt Output: soft brown stool Ostomy pouching: 1pc. Convex  Education provided:  performed pouch change  following up with surgery team to remove staples and they will assess stomal recession and retention at that time.     Impression/dx  Irritant contact dermatitis  Colostomy complication Discussion  Reinforced pouch change steps.  Spouse is performing ostomy care.  Plan  See back one week or as  needed for management of complex stomal pouching    Visit time: 50 minutes.   Mike Gip FNP-BC

## 2023-09-20 ENCOUNTER — Ambulatory Visit (HOSPITAL_COMMUNITY): Payer: 59 | Admitting: Nurse Practitioner

## 2023-09-23 ENCOUNTER — Ambulatory Visit (HOSPITAL_COMMUNITY)
Admission: RE | Admit: 2023-09-23 | Discharge: 2023-09-23 | Disposition: A | Discharge status: Home / Self Care | Payer: 59 | Source: Ambulatory Visit | Attending: Nurse Practitioner | Admitting: Nurse Practitioner

## 2023-09-23 DIAGNOSIS — K9409 Other complications of colostomy: Secondary | ICD-10-CM | POA: Insufficient documentation

## 2023-09-23 DIAGNOSIS — L24B3 Irritant contact dermatitis related to fecal or urinary stoma or fistula: Secondary | ICD-10-CM | POA: Insufficient documentation

## 2023-09-23 DIAGNOSIS — K9400 Colostomy complication, unspecified: Secondary | ICD-10-CM

## 2023-09-23 NOTE — Progress Notes (Signed)
 [facility]   Reason for visit:  LLQ colostomy, recessed below skin level with retention rod in place.  HPI:   Past Medical History:  Diagnosis Date   Diverticulitis    Fistula    Hemorrhoid    Hypercholesteremia    Hypertension    Lichenoid dermatitis    Obesity    PONV (postoperative nausea and vomiting)    Seasonal allergies    Sleep apnea    cpap   Family History  Problem Relation Age of Onset   Colon polyps Father    Prostate cancer Father    Aortic aneurysm Mother    Hypertension Mother    Heart disease Mother    Colon polyps Sister    Allergies  Allergen Reactions   Peanut-Containing Drug Products Anaphylaxis, Itching and Swelling    *Walnuts and Peacan's-not peanuts* Swelling of throat    Ciprofloxacin     rash   Hydrocodone-Acetaminophen    Losartan Rash   Norvasc [Amlodipine Besylate] Rash   Sulfa Antibiotics Rash   Current Outpatient Medications  Medication Sig Dispense Refill Last Dose/Taking   acetaminophen (TYLENOL) 500 MG tablet Take 1,000 mg by mouth every 6 (six) hours as needed (pain.).      Cholecalciferol (VITAMIN D3) 50 MCG (2000 UT) TABS Take 2,000 Units by mouth at bedtime.      docusate sodium (COLACE) 100 MG capsule Take 100 mg by mouth at bedtime.      ibuprofen (ADVIL) 200 MG tablet Take 400 mg by mouth every 8 (eight) hours as needed (pain.).      lisinopril-hydrochlorothiazide (ZESTORETIC) 10-12.5 MG tablet Take 1 tablet by mouth at bedtime.  0    loratadine (CLARITIN) 10 MG tablet Take 10 mg by mouth at bedtime.      Magnesium 400 MG TABS Take 400 mg by mouth at bedtime.      metoprolol succinate (TOPROL-XL) 50 MG 24 hr tablet Take 50 mg by mouth at bedtime.  0    Multiple Vitamin (MULTIVITAMIN WITH MINERALS) TABS tablet Take 1 tablet by mouth at bedtime. One A Day for Women      Probiotic Product (CULTURELLE PROBIOTICS PO) Take 1 capsule by mouth at bedtime.      No current facility-administered medications for this  encounter.   ROS  Review of Systems  Constitutional:  Positive for fatigue.  Gastrointestinal:        LLQ loop colostomy   Genitourinary:        Frequent UTIs  Musculoskeletal: Negative.   Skin:  Positive for rash and wound.  Psychiatric/Behavioral: Negative.    All other systems reviewed and are negative.  Vital signs:  There were no vitals taken for this visit. Exam:  Physical Exam Constitutional:      Appearance: She is obese.  HENT:     Mouth/Throat:     Mouth: Mucous membranes are moist.  Abdominal:     Palpations: Abdomen is soft.  Musculoskeletal:        General: Normal range of motion.  Skin:    General: Skin is warm and dry.     Findings: Erythema present.  Neurological:     General: No focal deficit present.     Mental Status: She is alert and oriented to person, place, and time.  Psychiatric:        Mood and Affect: Mood normal.        Behavior: Behavior normal.     Stoma type/location:  LLQ colostomy  Stomal assessment/size:  1 1/4" recessed below skin line  red rubber retention remains in place. Is productive of stool Peristomal assessment:  erythema and tenderness  midline incision staples have been removed. Edges approximated Treatment options for stomal/peristomal skin: stoma powder and skin prep  barrier ring and 1 piece convex pouch with barrier strips Output: soft brown stool Ostomy pouching: 1pc. convex Education provided:  spouse providing most ostomy care.     Impression/dx  Colostomy complication Contact irritant dermatitis Discussion  See back as needed.  Plan  Will follow up with Dr Cliffton Asters for removal of retention    Visit time: 45 minutes.   Mike Gip FNP-BC

## 2023-09-26 NOTE — Discharge Instructions (Signed)
 Continue pouching as before. Stopped belt  added barrier strips to pouch perimeter.

## 2023-09-30 NOTE — Discharge Instructions (Signed)
 Follow up with Dr Cliffton Asters regarding red rubber retention removal

## 2023-10-04 ENCOUNTER — Ambulatory Visit (HOSPITAL_COMMUNITY)
Admission: RE | Admit: 2023-10-04 | Discharge: 2023-10-04 | Disposition: A | Discharge status: Home / Self Care | Payer: 59 | Source: Ambulatory Visit | Attending: Nurse Practitioner | Admitting: Nurse Practitioner

## 2023-10-04 DIAGNOSIS — Z933 Colostomy status: Secondary | ICD-10-CM | POA: Diagnosis present

## 2023-10-04 DIAGNOSIS — K9400 Colostomy complication, unspecified: Secondary | ICD-10-CM

## 2023-10-04 DIAGNOSIS — L24B3 Irritant contact dermatitis related to fecal or urinary stoma or fistula: Secondary | ICD-10-CM

## 2023-10-04 NOTE — Progress Notes (Signed)
 Natasha Wilkinson   Reason for visit:  LLQ Colostomy, recessed below skin level   HPI:  Diverticulitis with colostomy Past Medical History:  Diagnosis Date   Diverticulitis    Fistula    Hemorrhoid    Hypercholesteremia    Hypertension    Lichenoid dermatitis    Obesity    PONV (postoperative nausea and vomiting)    Seasonal allergies    Sleep apnea    cpap   Family History  Problem Relation Age of Onset   Colon polyps Father    Prostate cancer Father    Aortic aneurysm Mother    Hypertension Mother    Heart disease Mother    Colon polyps Sister    Allergies  Allergen Reactions   Peanut-Containing Drug Products Anaphylaxis, Itching and Swelling    *Walnuts and Peacan's-not peanuts* Swelling of throat    Ciprofloxacin     rash   Hydrocodone-Acetaminophen    Losartan Rash   Norvasc [Amlodipine Besylate] Rash   Sulfa Antibiotics Rash   Current Outpatient Medications  Medication Sig Dispense Refill Last Dose/Taking   acetaminophen (TYLENOL) 500 MG tablet Take 1,000 mg by mouth every 6 (six) hours as needed (pain.).      Cholecalciferol (VITAMIN D3) 50 MCG (2000 UT) TABS Take 2,000 Units by mouth at bedtime.      docusate sodium (COLACE) 100 MG capsule Take 100 mg by mouth at bedtime.      ibuprofen (ADVIL) 200 MG tablet Take 400 mg by mouth every 8 (eight) hours as needed (pain.).      lisinopril-hydrochlorothiazide (ZESTORETIC) 10-12.5 MG tablet Take 1 tablet by mouth at bedtime.  0    loratadine (CLARITIN) 10 MG tablet Take 10 mg by mouth at bedtime.      Magnesium 400 MG TABS Take 400 mg by mouth at bedtime.      metoprolol succinate (TOPROL-XL) 50 MG 24 hr tablet Take 50 mg by mouth at bedtime.  0    Multiple Vitamin (MULTIVITAMIN WITH MINERALS) TABS tablet Take 1 tablet by mouth at bedtime. One A Day for Women      Probiotic Product (CULTURELLE PROBIOTICS PO) Take 1 capsule by mouth at bedtime.      No current facility-administered medications for this  encounter.   ROS  Review of Systems  Constitutional:  Positive for fatigue.  Gastrointestinal:  Positive for constipation.       Colostomy, recessed  Psychiatric/Behavioral: Negative.    All other systems reviewed and are negative.  Vital signs:  BP (P) 135/72 (BP Location: Right Arm)   Pulse (P) 85   Temp (P) 97.8 F (36.6 C) (Oral)   Resp (P) 18   SpO2 (P) 99%  Exam:  Physical Exam Vitals reviewed.  Constitutional:      Appearance: She is obese.  HENT:     Mouth/Throat:     Mouth: Mucous membranes are moist.  Cardiovascular:     Rate and Rhythm: Normal rate and regular rhythm.  Abdominal:     Palpations: Abdomen is soft.  Musculoskeletal:        General: Normal range of motion.     Comments: Generalized weakness  Skin:    General: Skin is warm and dry.  Neurological:     Mental Status: She is alert and oriented to person, place, and time. Mental status is at baseline.  Psychiatric:        Mood and Affect: Mood normal.  Behavior: Behavior normal.     Stoma type/location:  LLQ colostomy Stomal assessment/size:  1 1/4" recessed further below skin line today,  2 cm defect.  is productive of stool Peristomal assessment:  erythema intact, improved Treatment options for stomal/peristomal skin: stoma powder and skin prep  barrier ring 1 piece convex with barrier strips.  Adding belt today for security  she tried before but did not find it comfortable.  She is willing to try again.  Explained that recession is more significant today and the support will improve seal Output: soft brown stool Ostomy pouching: 1pc. Convex with barrier ring barrier strips and belt Education provided:  see above  implement belt.  Applied today.     Impression/dx  Colostomy complication Irritant dermatitis Discussion  Provided belt.  Plan  No issues with supplies.  FOllow up as needed    Visit time: 45 minutes.   Mike Gip FNP-BC

## 2023-10-11 ENCOUNTER — Ambulatory Visit (HOSPITAL_COMMUNITY): Payer: 59 | Admitting: Nurse Practitioner

## 2023-10-12 NOTE — Discharge Instructions (Signed)
 Added ostomy belt.   Monitor stomal recession

## 2023-10-14 ENCOUNTER — Ambulatory Visit (HOSPITAL_COMMUNITY)
Admission: RE | Admit: 2023-10-14 | Discharge: 2023-10-14 | Disposition: A | Discharge status: Home / Self Care | Source: Ambulatory Visit | Attending: Plastic Surgery | Admitting: Plastic Surgery

## 2023-10-14 DIAGNOSIS — K9400 Colostomy complication, unspecified: Secondary | ICD-10-CM

## 2023-10-14 DIAGNOSIS — L24B3 Irritant contact dermatitis related to fecal or urinary stoma or fistula: Secondary | ICD-10-CM | POA: Insufficient documentation

## 2023-10-14 DIAGNOSIS — Z933 Colostomy status: Secondary | ICD-10-CM | POA: Diagnosis present

## 2023-10-14 NOTE — Discharge Instructions (Signed)
 No changes at this time.

## 2023-10-14 NOTE — Progress Notes (Signed)
 [facility]   Reason for visit:  LLq colostomy with recession and peristomal skin breakdown HPI:  Diverticulitis with resection and  colostomy Past Medical History:  Diagnosis Date  . Diverticulitis   . Fistula   . Hemorrhoid   . Hypercholesteremia   . Hypertension   . Lichenoid dermatitis   . Obesity   . PONV (postoperative nausea and vomiting)   . Seasonal allergies   . Sleep apnea    cpap   Family History  Problem Relation Age of Onset  . Colon polyps Father   . Prostate cancer Father   . Aortic aneurysm Mother   . Hypertension Mother   . Heart disease Mother   . Colon polyps Sister    Allergies  Allergen Reactions  . Peanut-Containing Drug Products Anaphylaxis, Itching and Swelling    *Walnuts and Peacan's-not peanuts* Swelling of throat   . Ciprofloxacin     rash  . Hydrocodone-Acetaminophen   . Losartan Rash  . Norvasc [Amlodipine Besylate] Rash  . Sulfa Antibiotics Rash   Current Outpatient Medications  Medication Sig Dispense Refill Last Dose/Taking  . acetaminophen (TYLENOL) 500 MG tablet Take 1,000 mg by mouth every 6 (six) hours as needed (pain.).     Marland Kitchen Cholecalciferol (VITAMIN D3) 50 MCG (2000 UT) TABS Take 2,000 Units by mouth at bedtime.     . docusate sodium (COLACE) 100 MG capsule Take 100 mg by mouth at bedtime.     Marland Kitchen ibuprofen (ADVIL) 200 MG tablet Take 400 mg by mouth every 8 (eight) hours as needed (pain.).     Marland Kitchen lisinopril-hydrochlorothiazide (ZESTORETIC) 10-12.5 MG tablet Take 1 tablet by mouth at bedtime.  0   . loratadine (CLARITIN) 10 MG tablet Take 10 mg by mouth at bedtime.     . Magnesium 400 MG TABS Take 400 mg by mouth at bedtime.     . metoprolol succinate (TOPROL-XL) 50 MG 24 hr tablet Take 50 mg by mouth at bedtime.  0   . Multiple Vitamin (MULTIVITAMIN WITH MINERALS) TABS tablet Take 1 tablet by mouth at bedtime. One A Day for Women     . Probiotic Product (CULTURELLE PROBIOTICS PO) Take 1 capsule by mouth at bedtime.       No current facility-administered medications for this encounter.   ROS  Review of Systems  Constitutional:  Positive for fatigue.  Gastrointestinal:  Positive for constipation.       LLQ recessed colostomy  Skin:  Positive for color change and rash.       Peristomal breakdown  Psychiatric/Behavioral:  The patient is nervous/anxious.   All other systems reviewed and are negative. Vital signs:  BP 118/72   Pulse 69   Temp 97.9 F (36.6 C) (Oral)   Resp 18   SpO2 98%  Exam:  Physical Exam Vitals reviewed.  Constitutional:      Appearance: She is obese.  Cardiovascular:     Rate and Rhythm: Normal rate and regular rhythm.     Pulses: Normal pulses.     Heart sounds: Normal heart sounds.  Musculoskeletal:        General: Normal range of motion.  Skin:    General: Skin is warm and dry.     Findings: Erythema and rash present.  Neurological:     Mental Status: She is alert and oriented to person, place, and time. Mental status is at baseline.  Psychiatric:        Mood and Affect: Mood normal.  Behavior: Behavior normal.    Stoma type/location:  LLQ recessed stoma  Stomal assessment/size:  oval  2 cm x 3 cm , recessed below skin level  photo in chart.  Peristomal assessment:  denuded skin along bottom half Treatment options for stomal/peristomal skin: stoma powder and skin prep  Barrier ring x 2 to widen peristomal protection of peristomal skin  Output: soft brown stool Ostomy pouching: 1pc. Convex with barrier ring, belt and barrier strips to secure pouch seal.  Education provided:  protected irritated skin with stoma powder and skin prep. 2 barrier rings, flexible convex pouch    Impression/dx  Contact irritant dermatitis colostomy Discussion  See above.  Spouse is out of town for a few days,  patient is performing care independently at this time.  Plan  See back as needed.     Visit time: 45 minutes.   Mike Gip FNP-BC

## 2023-10-21 ENCOUNTER — Ambulatory Visit (HOSPITAL_COMMUNITY)
Admission: RE | Admit: 2023-10-21 | Discharge: 2023-10-21 | Disposition: A | Discharge status: Home / Self Care | Source: Ambulatory Visit | Attending: Nurse Practitioner | Admitting: Nurse Practitioner

## 2023-10-21 DIAGNOSIS — K9409 Other complications of colostomy: Secondary | ICD-10-CM | POA: Insufficient documentation

## 2023-10-21 DIAGNOSIS — L24B3 Irritant contact dermatitis related to fecal or urinary stoma or fistula: Secondary | ICD-10-CM | POA: Insufficient documentation

## 2023-10-21 DIAGNOSIS — K9400 Colostomy complication, unspecified: Secondary | ICD-10-CM

## 2023-10-21 NOTE — Progress Notes (Signed)
 [facility]   Reason for visit:  LLQ recessed colostomy HPI:  Diverticulitis with resection and end colostomy Past Medical History:  Diagnosis Date   Diverticulitis    Fistula    Hemorrhoid    Hypercholesteremia    Hypertension    Lichenoid dermatitis    Obesity    PONV (postoperative nausea and vomiting)    Seasonal allergies    Sleep apnea    cpap   Family History  Problem Relation Age of Onset   Colon polyps Father    Prostate cancer Father    Aortic aneurysm Mother    Hypertension Mother    Heart disease Mother    Colon polyps Sister    Allergies  Allergen Reactions   Peanut-Containing Drug Products Anaphylaxis, Itching and Swelling    *Walnuts and Peacan's-not peanuts* Swelling of throat    Ciprofloxacin     rash   Hydrocodone-Acetaminophen    Losartan Rash   Norvasc [Amlodipine Besylate] Rash   Sulfa Antibiotics Rash   Current Outpatient Medications  Medication Sig Dispense Refill Last Dose/Taking   acetaminophen (TYLENOL) 500 MG tablet Take 1,000 mg by mouth every 6 (six) hours as needed (pain.).      Cholecalciferol (VITAMIN D3) 50 MCG (2000 UT) TABS Take 2,000 Units by mouth at bedtime.      docusate sodium (COLACE) 100 MG capsule Take 100 mg by mouth at bedtime.      ibuprofen (ADVIL) 200 MG tablet Take 400 mg by mouth every 8 (eight) hours as needed (pain.).      lisinopril-hydrochlorothiazide (ZESTORETIC) 10-12.5 MG tablet Take 1 tablet by mouth at bedtime.  0    loratadine (CLARITIN) 10 MG tablet Take 10 mg by mouth at bedtime.      Magnesium 400 MG TABS Take 400 mg by mouth at bedtime.      metoprolol succinate (TOPROL-XL) 50 MG 24 hr tablet Take 50 mg by mouth at bedtime.  0    Multiple Vitamin (MULTIVITAMIN WITH MINERALS) TABS tablet Take 1 tablet by mouth at bedtime. One A Day for Women      Probiotic Product (CULTURELLE PROBIOTICS PO) Take 1 capsule by mouth at bedtime.      No current facility-administered medications for this  encounter.   ROS  Review of Systems  Constitutional:  Positive for fatigue.  Gastrointestinal:  Positive for abdominal pain.       Vague abdominal pain.  Dull.  Some thick stool, likely pain is due to constipation. To continue colace and miralax as needed  Colostomy below skin level  Skin:  Positive for color change and rash.       Peristomal irritation  Psychiatric/Behavioral: Negative.    All other systems reviewed and are negative.  Vital signs:  BP 114/63 (BP Location: Right Arm)   Pulse 75   Temp 98.3 F (36.8 C) (Oral)   Resp 18   SpO2 98%  Exam:  Physical Exam Vitals reviewed.  Constitutional:      Appearance: She is obese.  Cardiovascular:     Rate and Rhythm: Normal rate and regular rhythm.     Heart sounds: Normal heart sounds.  Pulmonary:     Effort: Pulmonary effort is normal.     Breath sounds: Normal breath sounds.  Abdominal:     Palpations: Abdomen is soft.  Musculoskeletal:        General: Normal range of motion.     Cervical back: Normal range of motion.  Skin:  General: Skin is warm and dry.     Findings: Erythema present.  Neurological:     General: No focal deficit present.     Mental Status: She is alert and oriented to person, place, and time. Mental status is at baseline.     Stoma type/location:  LLQ colostomy, below skin level Stomal assessment/size:  oval opening with 1 3/8" stoma  Peristomal assessment:  denuded, improved  Treatment options for stomal/peristomal skin: stoma powder and skin prep barrier ring and 1 piece convex pouch Output: soft brown stool Ostomy pouching: 1pc. Convex  patient and spouse working well together with ostomy care.  Education provided:  fewer unexpected leaks.     Impression/dx  Irritant dermatitis  Colostomy complication Discussion  No changes to pouching at this time.  Continue to protect skin with powder and skin prep with barrier ring  Plan  See back as needed     Visit time: 40 minutes.    Mike Gip FNP-BC

## 2023-10-28 ENCOUNTER — Ambulatory Visit (HOSPITAL_COMMUNITY)
Admission: RE | Admit: 2023-10-28 | Discharge: 2023-10-28 | Disposition: A | Discharge status: Home / Self Care | Source: Ambulatory Visit | Attending: Nurse Practitioner | Admitting: Nurse Practitioner

## 2023-10-28 DIAGNOSIS — K9400 Colostomy complication, unspecified: Secondary | ICD-10-CM | POA: Diagnosis not present

## 2023-10-28 DIAGNOSIS — L24B3 Irritant contact dermatitis related to fecal or urinary stoma or fistula: Secondary | ICD-10-CM | POA: Diagnosis not present

## 2023-10-28 DIAGNOSIS — Z933 Colostomy status: Secondary | ICD-10-CM | POA: Diagnosis present

## 2023-10-28 NOTE — Progress Notes (Signed)
 [facility]   Reason for visit:  LLQ recessed colostomy HPI:  Diverticulitis with resection and end colostomy Past Medical History:  Diagnosis Date   Diverticulitis    Fistula    Hemorrhoid    Hypercholesteremia    Hypertension    Lichenoid dermatitis    Obesity    PONV (postoperative nausea and vomiting)    Seasonal allergies    Sleep apnea    cpap   Family History  Problem Relation Age of Onset   Colon polyps Father    Prostate cancer Father    Aortic aneurysm Mother    Hypertension Mother    Heart disease Mother    Colon polyps Sister    Allergies  Allergen Reactions   Peanut-Containing Drug Products Anaphylaxis, Itching and Swelling    *Walnuts and Peacan's-not peanuts* Swelling of throat    Ciprofloxacin     rash   Hydrocodone-Acetaminophen    Losartan Rash   Norvasc [Amlodipine Besylate] Rash   Sulfa Antibiotics Rash   Current Outpatient Medications  Medication Sig Dispense Refill Last Dose/Taking   acetaminophen (TYLENOL) 500 MG tablet Take 1,000 mg by mouth every 6 (six) hours as needed (pain.).      Cholecalciferol (VITAMIN D3) 50 MCG (2000 UT) TABS Take 2,000 Units by mouth at bedtime.      docusate sodium (COLACE) 100 MG capsule Take 100 mg by mouth at bedtime.      ibuprofen (ADVIL) 200 MG tablet Take 400 mg by mouth every 8 (eight) hours as needed (pain.).      lisinopril-hydrochlorothiazide (ZESTORETIC) 10-12.5 MG tablet Take 1 tablet by mouth at bedtime.  0    loratadine (CLARITIN) 10 MG tablet Take 10 mg by mouth at bedtime.      Magnesium 400 MG TABS Take 400 mg by mouth at bedtime.      metoprolol succinate (TOPROL-XL) 50 MG 24 hr tablet Take 50 mg by mouth at bedtime.  0    Multiple Vitamin (MULTIVITAMIN WITH MINERALS) TABS tablet Take 1 tablet by mouth at bedtime. One A Day for Women      Probiotic Product (CULTURELLE PROBIOTICS PO) Take 1 capsule by mouth at bedtime.      No current facility-administered medications for this  encounter.   ROS  Review of Systems  Gastrointestinal:  Positive for constipation.       LLQ colostomy  Musculoskeletal: Negative.   Skin:  Positive for color change and rash.       Erythema to peristomal skin   Psychiatric/Behavioral: Negative.    All other systems reviewed and are negative.  Vital signs:  BP 107/61 (BP Location: Right Arm)   Pulse 78   Temp 97.9 F (36.6 C) (Oral)   Resp 18   SpO2 98%  Exam:  Physical Exam Vitals reviewed.  Constitutional:      Appearance: She is obese.  HENT:     Mouth/Throat:     Mouth: Mucous membranes are moist.  Cardiovascular:     Rate and Rhythm: Normal rate and regular rhythm.  Pulmonary:     Effort: Pulmonary effort is normal.     Breath sounds: Normal breath sounds.  Abdominal:     Palpations: Abdomen is soft.  Musculoskeletal:        General: Normal range of motion.  Skin:    General: Skin is warm and dry.     Findings: Erythema present.  Neurological:     Mental Status: She is alert and  oriented to person, place, and time. Mental status is at baseline.  Psychiatric:        Mood and Affect: Mood normal.        Behavior: Behavior normal.     Stoma type/location:  LLQ colostomy below skin level Stomal assessment/size:  oval opening 1 3/8" stoma Peristomal assessment:  improved dermatitis  erythema and some denuded skin .  Treatment options for stomal/peristomal skin: stoma powder and skin prep  barrier ring and 1 piece pouch with belt.  Output: soft brown stool  Ostomy pouching: 1pc. Convex  Education provided:  ongoing teaching  with patient and spouse for ostomy care, peristomal skin care    Impression/dx  Colostomy complication Contact irritant dermatitis Discussion  No changes to pouching process at this time.  Plan  Patient pleased with current pouching  Very few leaks.     Visit time: 45 minutes.   Mike Gip FNP-BC

## 2023-10-28 NOTE — Discharge Instructions (Signed)
No changes - You are doing great!

## 2023-11-04 ENCOUNTER — Other Ambulatory Visit (HOSPITAL_COMMUNITY): Payer: Self-pay | Admitting: Family Medicine

## 2023-11-04 DIAGNOSIS — Z136 Encounter for screening for cardiovascular disorders: Secondary | ICD-10-CM

## 2023-11-07 NOTE — Discharge Instructions (Signed)
 No changes to process today.

## 2023-12-02 ENCOUNTER — Ambulatory Visit (HOSPITAL_BASED_OUTPATIENT_CLINIC_OR_DEPARTMENT_OTHER)
Admission: RE | Admit: 2023-12-02 | Discharge: 2023-12-02 | Disposition: A | Discharge status: Home / Self Care | Payer: Self-pay | Source: Ambulatory Visit | Attending: Family Medicine | Admitting: Family Medicine

## 2023-12-02 DIAGNOSIS — Z136 Encounter for screening for cardiovascular disorders: Secondary | ICD-10-CM | POA: Insufficient documentation

## 2023-12-30 DIAGNOSIS — I10 Essential (primary) hypertension: Secondary | ICD-10-CM | POA: Diagnosis not present

## 2023-12-30 DIAGNOSIS — E7800 Pure hypercholesterolemia, unspecified: Secondary | ICD-10-CM | POA: Diagnosis not present

## 2023-12-30 DIAGNOSIS — E669 Obesity, unspecified: Secondary | ICD-10-CM | POA: Diagnosis not present

## 2023-12-30 DIAGNOSIS — R7303 Prediabetes: Secondary | ICD-10-CM | POA: Diagnosis not present

## 2024-01-03 DIAGNOSIS — Z8719 Personal history of other diseases of the digestive system: Secondary | ICD-10-CM | POA: Diagnosis not present

## 2024-01-03 DIAGNOSIS — K432 Incisional hernia without obstruction or gangrene: Secondary | ICD-10-CM | POA: Diagnosis not present

## 2024-01-03 DIAGNOSIS — Z933 Colostomy status: Secondary | ICD-10-CM | POA: Diagnosis not present

## 2024-01-13 DIAGNOSIS — Z933 Colostomy status: Secondary | ICD-10-CM | POA: Diagnosis not present

## 2024-01-31 DIAGNOSIS — Z01419 Encounter for gynecological examination (general) (routine) without abnormal findings: Secondary | ICD-10-CM | POA: Diagnosis not present

## 2024-01-31 DIAGNOSIS — M816 Localized osteoporosis [Lequesne]: Secondary | ICD-10-CM | POA: Diagnosis not present

## 2024-01-31 DIAGNOSIS — Z1231 Encounter for screening mammogram for malignant neoplasm of breast: Secondary | ICD-10-CM | POA: Diagnosis not present

## 2024-01-31 DIAGNOSIS — Z6834 Body mass index (BMI) 34.0-34.9, adult: Secondary | ICD-10-CM | POA: Diagnosis not present

## 2024-02-07 ENCOUNTER — Ambulatory Visit (HOSPITAL_COMMUNITY)
Admission: RE | Admit: 2024-02-07 | Discharge: 2024-02-07 | Disposition: A | Discharge status: Home / Self Care | Payer: Self-pay | Source: Ambulatory Visit | Attending: *Deleted | Admitting: *Deleted

## 2024-02-07 DIAGNOSIS — L24B3 Irritant contact dermatitis related to fecal or urinary stoma or fistula: Secondary | ICD-10-CM | POA: Diagnosis not present

## 2024-02-07 DIAGNOSIS — K9400 Colostomy complication, unspecified: Secondary | ICD-10-CM | POA: Diagnosis not present

## 2024-02-07 DIAGNOSIS — K9409 Other complications of colostomy: Secondary | ICD-10-CM | POA: Diagnosis not present

## 2024-02-07 NOTE — Progress Notes (Signed)
  [facility]   Reason for visit:  LLQ recessed  HPI:  Diverticulitis with perforation Past Medical History:  Diagnosis Date   Diverticulitis    Fistula    Hemorrhoid    Hypercholesteremia    Hypertension    Lichenoid dermatitis    Obesity    PONV (postoperative nausea and vomiting)    Seasonal allergies    Sleep apnea    cpap   Family History  Problem Relation Age of Onset   Colon polyps Father    Prostate cancer Father    Aortic aneurysm Mother    Hypertension Mother    Heart disease Mother    Colon polyps Sister    Allergies  Allergen Reactions   Peanut-Containing Drug Products Anaphylaxis, Itching and Swelling    *Walnuts and Peacan's-not peanuts* Swelling of throat    Ciprofloxacin      rash   Hydrocodone -Acetaminophen     Losartan Rash   Norvasc [Amlodipine Besylate] Rash   Sulfa Antibiotics Rash   Current Outpatient Medications  Medication Sig Dispense Refill Last Dose/Taking   acetaminophen  (TYLENOL ) 500 MG tablet Take 1,000 mg by mouth every 6 (six) hours as needed (pain.).      Cholecalciferol  (VITAMIN D3) 50 MCG (2000 UT) TABS Take 2,000 Units by mouth at bedtime.      docusate sodium (COLACE) 100 MG capsule Take 100 mg by mouth at bedtime.      ibuprofen  (ADVIL ) 200 MG tablet Take 400 mg by mouth every 8 (eight) hours as needed (pain.).      lisinopril -hydrochlorothiazide  (ZESTORETIC ) 10-12.5 MG tablet Take 1 tablet by mouth at bedtime.  0    loratadine  (CLARITIN ) 10 MG tablet Take 10 mg by mouth at bedtime.      Magnesium  400 MG TABS Take 400 mg by mouth at bedtime.      metoprolol  succinate (TOPROL -XL) 50 MG 24 hr tablet Take 50 mg by mouth at bedtime.  0    Multiple Vitamin (MULTIVITAMIN WITH MINERALS) TABS tablet Take 1 tablet by mouth at bedtime. One A Day for Women      Probiotic Product (CULTURELLE PROBIOTICS PO) Take 1 capsule by mouth at bedtime.      No current facility-administered medications for this encounter.   ROS  Review  of Systems  Respiratory: Negative.    Cardiovascular: Negative.   Gastrointestinal:        Hernia repair Bowel obstruction  Psychiatric/Behavioral: Negative.    All other systems reviewed and are negative.  Vital signs:  BP 116/81 (BP Location: Right Arm)   Pulse 64   Temp 97.6 F (36.4 C) (Oral)   Resp 18   SpO2 96%  Exam:  Physical Exam Vitals reviewed.  Constitutional:      Appearance: Normal appearance.  Cardiovascular:     Rate and Rhythm: Normal rate and regular rhythm.  Pulmonary:     Breath sounds: Normal breath sounds.  Abdominal:     Palpations: Abdomen is soft.     Comments: Recessed stoma  Neurological:     Mental Status: She is alert.     Stoma type/location:  LLQ colostomy, recessed Stomal assessment/size:  1 3/8 recessed below skin level Peristomal assessment:  some denuded skin, stoma is recessed and in a circumferential valley.  Treatment options for stomal/peristomal skin: stoma powder skin prep barrier ring and  Output: soft brown stool Ostomy pouching: 1pc. Convex (coloplast ) and ostomy belt.  Barrier strips to perimeter to secure pouch seal.  Education  provided:  continue same pouching.  Ostomy supplies are established and being delivered without issue.     Impression/dx  Colostomy complication Irritant contact dermatitis  Discussion  See back as needed.  Plan  Continue same pouching     Visit time: 35 minutes.   Darice Cooley FNP-BC

## 2024-02-09 DIAGNOSIS — H52203 Unspecified astigmatism, bilateral: Secondary | ICD-10-CM | POA: Diagnosis not present

## 2024-02-09 DIAGNOSIS — H2513 Age-related nuclear cataract, bilateral: Secondary | ICD-10-CM | POA: Diagnosis not present

## 2024-02-15 NOTE — Discharge Instructions (Signed)
 No changes at this time.

## 2024-02-16 DIAGNOSIS — I10 Essential (primary) hypertension: Secondary | ICD-10-CM | POA: Diagnosis not present

## 2024-02-16 DIAGNOSIS — Z0000 Encounter for general adult medical examination without abnormal findings: Secondary | ICD-10-CM | POA: Diagnosis not present

## 2024-02-16 DIAGNOSIS — Z23 Encounter for immunization: Secondary | ICD-10-CM | POA: Diagnosis not present

## 2024-02-16 DIAGNOSIS — E7800 Pure hypercholesterolemia, unspecified: Secondary | ICD-10-CM | POA: Diagnosis not present

## 2024-02-17 DIAGNOSIS — Z933 Colostomy status: Secondary | ICD-10-CM | POA: Diagnosis not present

## 2024-04-10 DIAGNOSIS — Z8719 Personal history of other diseases of the digestive system: Secondary | ICD-10-CM | POA: Diagnosis not present

## 2024-04-10 DIAGNOSIS — G8929 Other chronic pain: Secondary | ICD-10-CM | POA: Diagnosis not present

## 2024-04-10 DIAGNOSIS — R102 Pelvic and perineal pain: Secondary | ICD-10-CM | POA: Diagnosis not present

## 2024-04-10 DIAGNOSIS — N824 Other female intestinal-genital tract fistulae: Secondary | ICD-10-CM | POA: Diagnosis not present

## 2024-04-12 DIAGNOSIS — H25812 Combined forms of age-related cataract, left eye: Secondary | ICD-10-CM | POA: Diagnosis not present

## 2024-04-12 DIAGNOSIS — H2512 Age-related nuclear cataract, left eye: Secondary | ICD-10-CM | POA: Diagnosis not present

## 2024-04-17 DIAGNOSIS — Z933 Colostomy status: Secondary | ICD-10-CM | POA: Diagnosis not present

## 2024-04-19 DIAGNOSIS — R935 Abnormal findings on diagnostic imaging of other abdominal regions, including retroperitoneum: Secondary | ICD-10-CM | POA: Diagnosis not present

## 2024-04-19 DIAGNOSIS — N824 Other female intestinal-genital tract fistulae: Secondary | ICD-10-CM | POA: Diagnosis not present

## 2024-04-19 DIAGNOSIS — K5730 Diverticulosis of large intestine without perforation or abscess without bleeding: Secondary | ICD-10-CM | POA: Diagnosis not present

## 2024-04-19 DIAGNOSIS — Z8719 Personal history of other diseases of the digestive system: Secondary | ICD-10-CM | POA: Diagnosis not present

## 2024-04-19 DIAGNOSIS — G8929 Other chronic pain: Secondary | ICD-10-CM | POA: Diagnosis not present

## 2024-04-19 DIAGNOSIS — R102 Pelvic and perineal pain: Secondary | ICD-10-CM | POA: Diagnosis not present

## 2024-04-19 DIAGNOSIS — Z933 Colostomy status: Secondary | ICD-10-CM | POA: Diagnosis not present

## 2024-05-01 ENCOUNTER — Ambulatory Visit (HOSPITAL_COMMUNITY)
Admission: RE | Admit: 2024-05-01 | Discharge: 2024-05-01 | Disposition: A | Discharge status: Home / Self Care | Source: Ambulatory Visit | Attending: Nurse Practitioner | Admitting: Nurse Practitioner

## 2024-05-01 DIAGNOSIS — K9400 Colostomy complication, unspecified: Secondary | ICD-10-CM

## 2024-05-01 DIAGNOSIS — K5900 Constipation, unspecified: Secondary | ICD-10-CM | POA: Diagnosis not present

## 2024-05-01 DIAGNOSIS — L24B3 Irritant contact dermatitis related to fecal or urinary stoma or fistula: Secondary | ICD-10-CM | POA: Diagnosis not present

## 2024-05-01 DIAGNOSIS — K9409 Other complications of colostomy: Secondary | ICD-10-CM | POA: Diagnosis not present

## 2024-05-01 DIAGNOSIS — Z933 Colostomy status: Secondary | ICD-10-CM | POA: Diagnosis not present

## 2024-05-01 NOTE — Progress Notes (Signed)
 [facility]   Reason for visit:  Colostomy with recessed stoma and irritant contact dermatitis.  Concerned with two strings noted protruding from stoma.  Brings in a photo from home.  TOday, there are not any strings or fibrous material coming from stoma, or mucocutaneous junction. Photo in chart.  HPI:  Perforated diverticulum with colostomy, recessed below skin level  Past Medical History:  Diagnosis Date   Diverticulitis    Fistula    Hemorrhoid    Hypercholesteremia    Hypertension    Lichenoid dermatitis    Obesity    PONV (postoperative nausea and vomiting)    Seasonal allergies    Sleep apnea    cpap   Family History  Problem Relation Age of Onset   Colon polyps Father    Prostate cancer Father    Aortic aneurysm Mother    Hypertension Mother    Heart disease Mother    Colon polyps Sister    Allergies  Allergen Reactions   Peanut-Containing Drug Products Anaphylaxis, Itching and Swelling    *Walnuts and Peacan's-not peanuts* Swelling of throat    Ciprofloxacin      rash   Hydrocodone -Acetaminophen     Losartan Rash   Norvasc [Amlodipine Besylate] Rash   Sulfa Antibiotics Rash   Current Outpatient Medications  Medication Sig Dispense Refill Last Dose/Taking   acetaminophen  (TYLENOL ) 500 MG tablet Take 1,000 mg by mouth every 6 (six) hours as needed (pain.).      Cholecalciferol  (VITAMIN D3) 50 MCG (2000 UT) TABS Take 2,000 Units by mouth at bedtime.      docusate sodium (COLACE) 100 MG capsule Take 100 mg by mouth at bedtime.      ibuprofen  (ADVIL ) 200 MG tablet Take 400 mg by mouth every 8 (eight) hours as needed (pain.).      lisinopril -hydrochlorothiazide  (ZESTORETIC ) 10-12.5 MG tablet Take 1 tablet by mouth at bedtime.  0    loratadine  (CLARITIN ) 10 MG tablet Take 10 mg by mouth at bedtime.      Magnesium  400 MG TABS Take 400 mg by mouth at bedtime.      metoprolol  succinate (TOPROL -XL) 50 MG 24 hr tablet Take 50 mg by mouth at bedtime.  0     Multiple Vitamin (MULTIVITAMIN WITH MINERALS) TABS tablet Take 1 tablet by mouth at bedtime. One A Day for Women      Probiotic Product (CULTURELLE PROBIOTICS PO) Take 1 capsule by mouth at bedtime.      No current facility-administered medications for this encounter.   ROS  Review of Systems  Gastrointestinal:  Positive for constipation.       Recessed colostomy  Psychiatric/Behavioral: Negative.    All other systems reviewed and are negative.  Vital signs:  BP 118/75 (BP Location: Right Arm)   Pulse 98   Temp 98.4 F (36.9 C) (Oral)   Resp 20   SpO2 96%  Exam:  Physical Exam Vitals reviewed.  Constitutional:      Appearance: She is obese.  Cardiovascular:     Rate and Rhythm: Normal rate.  Pulmonary:     Effort: Pulmonary effort is normal.  Abdominal:     Palpations: Abdomen is soft.  Skin:    Findings: Erythema present.     Comments: Peristomal irritation  Neurological:     Mental Status: She is alert and oriented to person, place, and time. Mental status is at baseline.  Psychiatric:        Mood and Affect: Mood normal.  Behavior: Behavior normal.     Stoma type/location:  LLQ colostomy recessed,  Stomal assessment/size:  5/8 recessed but productive of soft brown stool.  Has been taking miralax  for constipation this week.  Peristomal assessment:  circumferential erythema around stoma.  Stoma is in a valley and recessed below skin level.  Stool is frequently found on skin at pouch changes.   Treatment options for stomal/peristomal skin: stoma powder, skin prep  barrier ring  1 piece convex pouch  Output: soft brown stool  Ostomy pouching: 1pc. Convex  Education provided:  The strings are no longer visible to examine.  With her constipation, the stoma could have been more retracted and sutures exposed. I look at photo and could be sutures, could be banana strings.  Stoma is functional, constipation is improved so I do not think there is any cause for concern.   She had called her surgeon's office as well.     Impression/dx  Colostomy complication Irritant contact dermatitis constipation Discussion  See back as needed.  Plan  No changes.     Visit time: 45 minutes.   Darice Cooley FNP-BC

## 2024-05-04 DIAGNOSIS — K9400 Colostomy complication, unspecified: Secondary | ICD-10-CM | POA: Insufficient documentation

## 2024-05-12 DIAGNOSIS — Z87442 Personal history of urinary calculi: Secondary | ICD-10-CM | POA: Diagnosis not present

## 2024-05-12 DIAGNOSIS — Z933 Colostomy status: Secondary | ICD-10-CM | POA: Diagnosis not present

## 2024-05-12 DIAGNOSIS — K9419 Other complications of enterostomy: Secondary | ICD-10-CM | POA: Diagnosis not present

## 2024-05-12 DIAGNOSIS — Z833 Family history of diabetes mellitus: Secondary | ICD-10-CM | POA: Diagnosis not present

## 2024-05-12 DIAGNOSIS — Z79899 Other long term (current) drug therapy: Secondary | ICD-10-CM | POA: Diagnosis not present

## 2024-05-12 DIAGNOSIS — R112 Nausea with vomiting, unspecified: Secondary | ICD-10-CM | POA: Diagnosis not present

## 2024-05-12 DIAGNOSIS — Z8249 Family history of ischemic heart disease and other diseases of the circulatory system: Secondary | ICD-10-CM | POA: Diagnosis not present

## 2024-05-12 DIAGNOSIS — K9413 Enterostomy malfunction: Secondary | ICD-10-CM | POA: Diagnosis not present

## 2024-05-12 DIAGNOSIS — I10 Essential (primary) hypertension: Secondary | ICD-10-CM | POA: Diagnosis not present

## 2024-05-12 DIAGNOSIS — R100 Acute abdomen: Secondary | ICD-10-CM | POA: Diagnosis not present

## 2024-05-12 DIAGNOSIS — K5909 Other constipation: Secondary | ICD-10-CM | POA: Diagnosis not present

## 2024-05-12 DIAGNOSIS — Z9049 Acquired absence of other specified parts of digestive tract: Secondary | ICD-10-CM | POA: Diagnosis not present

## 2024-05-12 DIAGNOSIS — K56609 Unspecified intestinal obstruction, unspecified as to partial versus complete obstruction: Secondary | ICD-10-CM | POA: Diagnosis not present

## 2024-05-12 DIAGNOSIS — K219 Gastro-esophageal reflux disease without esophagitis: Secondary | ICD-10-CM | POA: Diagnosis not present

## 2024-05-29 DIAGNOSIS — Z8719 Personal history of other diseases of the digestive system: Secondary | ICD-10-CM | POA: Diagnosis not present

## 2024-05-29 DIAGNOSIS — K9403 Colostomy malfunction: Secondary | ICD-10-CM | POA: Diagnosis not present

## 2024-05-29 DIAGNOSIS — K9419 Other complications of enterostomy: Secondary | ICD-10-CM | POA: Diagnosis not present

## 2024-05-31 DIAGNOSIS — Z01818 Encounter for other preprocedural examination: Secondary | ICD-10-CM | POA: Diagnosis not present

## 2024-05-31 DIAGNOSIS — G4733 Obstructive sleep apnea (adult) (pediatric): Secondary | ICD-10-CM | POA: Diagnosis not present

## 2024-05-31 DIAGNOSIS — K9403 Colostomy malfunction: Secondary | ICD-10-CM | POA: Diagnosis not present

## 2024-05-31 DIAGNOSIS — K219 Gastro-esophageal reflux disease without esophagitis: Secondary | ICD-10-CM | POA: Diagnosis not present

## 2024-05-31 DIAGNOSIS — I10 Essential (primary) hypertension: Secondary | ICD-10-CM | POA: Diagnosis not present

## 2024-05-31 DIAGNOSIS — E785 Hyperlipidemia, unspecified: Secondary | ICD-10-CM | POA: Diagnosis not present

## 2024-06-07 DIAGNOSIS — Y833 Surgical operation with formation of external stoma as the cause of abnormal reaction of the patient, or of later complication, without mention of misadventure at the time of the procedure: Secondary | ICD-10-CM | POA: Diagnosis not present

## 2024-06-07 DIAGNOSIS — K9409 Other complications of colostomy: Secondary | ICD-10-CM | POA: Diagnosis not present

## 2024-06-07 DIAGNOSIS — K9402 Colostomy infection: Secondary | ICD-10-CM | POA: Diagnosis not present

## 2024-06-07 DIAGNOSIS — I10 Essential (primary) hypertension: Secondary | ICD-10-CM | POA: Diagnosis not present

## 2024-06-07 DIAGNOSIS — D62 Acute posthemorrhagic anemia: Secondary | ICD-10-CM | POA: Diagnosis not present

## 2024-06-07 DIAGNOSIS — K9403 Colostomy malfunction: Secondary | ICD-10-CM | POA: Diagnosis not present

## 2024-06-07 DIAGNOSIS — K56699 Other intestinal obstruction unspecified as to partial versus complete obstruction: Secondary | ICD-10-CM | POA: Diagnosis not present

## 2024-06-19 ENCOUNTER — Ambulatory Visit (HOSPITAL_COMMUNITY)
Admission: RE | Admit: 2024-06-19 | Discharge: 2024-06-19 | Disposition: A | Source: Ambulatory Visit | Attending: *Deleted | Admitting: *Deleted

## 2024-06-19 DIAGNOSIS — L24B3 Irritant contact dermatitis related to fecal or urinary stoma or fistula: Secondary | ICD-10-CM | POA: Insufficient documentation

## 2024-06-19 DIAGNOSIS — K9400 Colostomy complication, unspecified: Secondary | ICD-10-CM | POA: Diagnosis not present

## 2024-06-19 DIAGNOSIS — Z933 Colostomy status: Secondary | ICD-10-CM | POA: Diagnosis not present

## 2024-06-19 DIAGNOSIS — Z433 Encounter for attention to colostomy: Secondary | ICD-10-CM | POA: Insufficient documentation

## 2024-06-19 NOTE — Progress Notes (Signed)
 [facility]   Reason for visit:  LLQ colostomy, recent revision, stoma is retracted.  HPI:  Colostomy stricture with revision of stoma  New stoma is retracted below skin level (Photo in chart)  Past Medical History:  Diagnosis Date   Diverticulitis    Fistula    Hemorrhoid    Hypercholesteremia    Hypertension    Lichenoid dermatitis    Obesity    PONV (postoperative nausea and vomiting)    Seasonal allergies    Sleep apnea    cpap   Family History  Problem Relation Age of Onset   Colon polyps Father    Prostate cancer Father    Aortic aneurysm Mother    Hypertension Mother    Heart disease Mother    Colon polyps Sister    Allergies  Allergen Reactions   Peanut-Containing Drug Products Anaphylaxis, Itching and Swelling    *Walnuts and Peacan's-not peanuts* Swelling of throat    Ciprofloxacin      rash   Hydrocodone -Acetaminophen     Losartan Rash   Norvasc [Amlodipine Besylate] Rash   Sulfa Antibiotics Rash   Current Outpatient Medications  Medication Sig Dispense Refill Last Dose/Taking   acetaminophen  (TYLENOL ) 500 MG tablet Take 1,000 mg by mouth every 6 (six) hours as needed (pain.).      Cholecalciferol  (VITAMIN D3) 50 MCG (2000 UT) TABS Take 2,000 Units by mouth at bedtime.      docusate sodium (COLACE) 100 MG capsule Take 100 mg by mouth at bedtime.      ibuprofen  (ADVIL ) 200 MG tablet Take 400 mg by mouth every 8 (eight) hours as needed (pain.).      lisinopril -hydrochlorothiazide  (ZESTORETIC ) 10-12.5 MG tablet Take 1 tablet by mouth at bedtime.  0    loratadine  (CLARITIN ) 10 MG tablet Take 10 mg by mouth at bedtime.      Magnesium  400 MG TABS Take 400 mg by mouth at bedtime.      metoprolol  succinate (TOPROL -XL) 50 MG 24 hr tablet Take 50 mg by mouth at bedtime.  0    Multiple Vitamin (MULTIVITAMIN WITH MINERALS) TABS tablet Take 1 tablet by mouth at bedtime. One A Day for Women      Probiotic Product (CULTURELLE PROBIOTICS PO) Take 1 capsule  by mouth at bedtime.      No current facility-administered medications for this encounter.   ROS  Review of Systems  Constitutional:  Positive for fatigue.  Respiratory: Negative.    Cardiovascular: Negative.   Gastrointestinal:        Colostomy with stricture.  Revision to new colostomy in same site,  is retracted.   Skin:  Positive for color change and rash.  Psychiatric/Behavioral:  The patient is nervous/anxious.   All other systems reviewed and are negative.  Vital signs:  BP 122/74   Pulse 61   Temp 98.2 F (36.8 C) (Oral)   Resp 20   SpO2 98%  Exam:  Physical Exam Vitals reviewed.  Constitutional:      Appearance: Normal appearance.  Cardiovascular:     Rate and Rhythm: Normal rate.  Pulmonary:     Effort: Pulmonary effort is normal.  Abdominal:     Palpations: Abdomen is soft.  Musculoskeletal:        General: Normal range of motion.  Skin:    General: Skin is warm and dry.     Findings: Erythema present.  Neurological:     Mental Status: She is alert. Mental status is at  baseline.  Psychiatric:        Mood and Affect: Mood normal.     Stoma type/location:  LLQ retracted colostomy, frequent leaks.  Spouse assists with care but is not able to be here today.  Stomal assessment/size:  Oval opening with 1 cm stoma below skin level.  There is complete separation at the mucocutaneous junction.  There is sloughing mucosa at the 12 o'clock.   Peristomal assessment:  Erythema from 3 to 10 o'clock from frequent leaks.  Was given 1 piece flat convatec pouch at another hospital with plastic clip and does not like this system.  Surgery was 06/07/24 and she does not remember how it looked in the hospital post op.   Treatment options for stomal/peristomal skin: She prefers [employer] products like she used before.  Placed her in 1 piece soft convex.  Stoma powder and skin prep to erythema and barrier ring to promote seal. Barrier opening cut slightly larger to facilitate  drainage into pouch.  Output: soft brown stool  Ostomy pouching: 1pc. Soft convex with barrier ring and belt.  Education provided:  we perform pouch change and I provide a few supplies for take home to try.      Impression/dx  Colostomy comlication Irritant contact dermatitis  Discussion  New pouch change procedure.  Spouse calls and listens to instructions.  Plan  See back as needed.  Can update [facility] as needed for new supplies.     Visit time: 45 minutes.   Darice Cooley FNP-BC

## 2024-06-20 DIAGNOSIS — Z933 Colostomy status: Secondary | ICD-10-CM | POA: Diagnosis not present

## 2024-07-03 DIAGNOSIS — K435 Parastomal hernia without obstruction or  gangrene: Secondary | ICD-10-CM | POA: Diagnosis not present

## 2024-07-10 DIAGNOSIS — Z933 Colostomy status: Secondary | ICD-10-CM | POA: Diagnosis not present

## 2024-07-17 DIAGNOSIS — Z7189 Other specified counseling: Secondary | ICD-10-CM | POA: Diagnosis not present
